# Patient Record
Sex: Female | Born: 1970 | Race: White | Hispanic: No | Marital: Married | Smoking: Former smoker
Health system: Southern US, Community
[De-identification: ages and names within clinical notes are randomized; demographics above are authoritative.]

## PROBLEM LIST (undated history)

## (undated) DIAGNOSIS — E785 Hyperlipidemia, unspecified: Secondary | ICD-10-CM

## (undated) DIAGNOSIS — D649 Anemia, unspecified: Secondary | ICD-10-CM

## (undated) DIAGNOSIS — R102 Pelvic and perineal pain: Secondary | ICD-10-CM

## (undated) DIAGNOSIS — Z8619 Personal history of other infectious and parasitic diseases: Secondary | ICD-10-CM

## (undated) DIAGNOSIS — M25551 Pain in right hip: Secondary | ICD-10-CM

## (undated) DIAGNOSIS — F329 Major depressive disorder, single episode, unspecified: Secondary | ICD-10-CM

## (undated) DIAGNOSIS — R51 Headache: Secondary | ICD-10-CM

## (undated) DIAGNOSIS — O09529 Supervision of elderly multigravida, unspecified trimester: Secondary | ICD-10-CM

## (undated) DIAGNOSIS — N898 Other specified noninflammatory disorders of vagina: Secondary | ICD-10-CM

## (undated) DIAGNOSIS — J302 Other seasonal allergic rhinitis: Secondary | ICD-10-CM

## (undated) DIAGNOSIS — M1990 Unspecified osteoarthritis, unspecified site: Secondary | ICD-10-CM

## (undated) DIAGNOSIS — F419 Anxiety disorder, unspecified: Secondary | ICD-10-CM

## (undated) DIAGNOSIS — F32A Depression, unspecified: Secondary | ICD-10-CM

## (undated) HISTORY — PX: TUBAL LIGATION: SHX77

## (undated) HISTORY — DX: Pain in right hip: M25.551

## (undated) HISTORY — DX: Major depressive disorder, single episode, unspecified: F32.9

## (undated) HISTORY — DX: Supervision of elderly multigravida, unspecified trimester: O09.529

## (undated) HISTORY — DX: Other specified noninflammatory disorders of vagina: N89.8

## (undated) HISTORY — DX: Personal history of other infectious and parasitic diseases: Z86.19

## (undated) HISTORY — PX: FETAL SURGERY SELECTIVE FETOSCOPIC LASER PHOTOCOAGULATION (SFLP) W/ LAPAROTOMY: SHX1620

## (undated) HISTORY — DX: Anxiety disorder, unspecified: F41.9

## (undated) HISTORY — DX: Pelvic and perineal pain: R10.2

## (undated) HISTORY — DX: Depression, unspecified: F32.A

---
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
## 1999-05-28 ENCOUNTER — Encounter: Payer: Self-pay | Admitting: Emergency Medicine

## 1999-05-28 ENCOUNTER — Emergency Department (HOSPITAL_COMMUNITY): Admission: EM | Admit: 1999-05-28 | Discharge: 1999-05-28 | Payer: Self-pay | Admitting: Emergency Medicine

## 1999-08-12 ENCOUNTER — Other Ambulatory Visit: Admission: RE | Admit: 1999-08-12 | Discharge: 1999-08-12 | Payer: Self-pay | Admitting: Family Medicine

## 2000-06-23 ENCOUNTER — Other Ambulatory Visit: Admission: RE | Admit: 2000-06-23 | Discharge: 2000-06-23 | Payer: Self-pay | Admitting: Emergency Medicine

## 2001-01-19 ENCOUNTER — Emergency Department (HOSPITAL_COMMUNITY): Admission: EM | Admit: 2001-01-19 | Discharge: 2001-01-19 | Payer: Self-pay | Admitting: Emergency Medicine

## 2001-12-06 ENCOUNTER — Other Ambulatory Visit: Admission: RE | Admit: 2001-12-06 | Discharge: 2001-12-06 | Payer: Self-pay | Admitting: Gynecology

## 2002-12-12 ENCOUNTER — Other Ambulatory Visit: Admission: RE | Admit: 2002-12-12 | Discharge: 2002-12-12 | Payer: Self-pay | Admitting: Gynecology

## 2003-12-20 ENCOUNTER — Other Ambulatory Visit: Admission: RE | Admit: 2003-12-20 | Discharge: 2003-12-20 | Payer: Self-pay | Admitting: Gynecology

## 2004-02-25 HISTORY — PX: WRIST SURGERY: SHX841

## 2004-08-21 ENCOUNTER — Ambulatory Visit (HOSPITAL_COMMUNITY): Admission: RE | Admit: 2004-08-21 | Discharge: 2004-08-21 | Payer: Self-pay | Admitting: Orthopedic Surgery

## 2004-08-21 ENCOUNTER — Ambulatory Visit (HOSPITAL_BASED_OUTPATIENT_CLINIC_OR_DEPARTMENT_OTHER): Admission: RE | Admit: 2004-08-21 | Discharge: 2004-08-21 | Payer: Self-pay | Admitting: Orthopedic Surgery

## 2005-03-19 ENCOUNTER — Ambulatory Visit (HOSPITAL_COMMUNITY): Admission: RE | Admit: 2005-03-19 | Discharge: 2005-03-19 | Payer: Self-pay | Admitting: Obstetrics and Gynecology

## 2005-08-18 ENCOUNTER — Inpatient Hospital Stay (HOSPITAL_COMMUNITY): Admission: AD | Admit: 2005-08-18 | Discharge: 2005-08-21 | Payer: Self-pay | Admitting: Obstetrics and Gynecology

## 2005-08-22 ENCOUNTER — Encounter: Admission: RE | Admit: 2005-08-22 | Discharge: 2005-09-20 | Payer: Self-pay | Admitting: Obstetrics and Gynecology

## 2005-09-19 ENCOUNTER — Ambulatory Visit: Admission: RE | Admit: 2005-09-19 | Discharge: 2005-09-19 | Payer: Self-pay | Admitting: Obstetrics and Gynecology

## 2005-09-21 ENCOUNTER — Encounter: Admission: RE | Admit: 2005-09-21 | Discharge: 2005-10-08 | Payer: Self-pay | Admitting: Obstetrics and Gynecology

## 2005-09-26 DIAGNOSIS — N898 Other specified noninflammatory disorders of vagina: Secondary | ICD-10-CM

## 2005-09-26 HISTORY — DX: Other specified noninflammatory disorders of vagina: N89.8

## 2005-10-23 DIAGNOSIS — R102 Pelvic and perineal pain unspecified side: Secondary | ICD-10-CM

## 2005-10-23 HISTORY — DX: Pelvic and perineal pain: R10.2

## 2005-10-23 HISTORY — DX: Pelvic and perineal pain unspecified side: R10.20

## 2005-11-28 ENCOUNTER — Other Ambulatory Visit: Admission: RE | Admit: 2005-11-28 | Discharge: 2005-11-28 | Payer: Self-pay | Admitting: Obstetrics and Gynecology

## 2006-09-03 ENCOUNTER — Ambulatory Visit (HOSPITAL_COMMUNITY): Admission: RE | Admit: 2006-09-03 | Discharge: 2006-09-03 | Payer: Self-pay | Admitting: Obstetrics and Gynecology

## 2006-09-07 ENCOUNTER — Inpatient Hospital Stay (HOSPITAL_COMMUNITY): Admission: AD | Admit: 2006-09-07 | Discharge: 2006-09-09 | Payer: Self-pay | Admitting: Obstetrics and Gynecology

## 2006-09-07 ENCOUNTER — Encounter: Payer: Self-pay | Admitting: Obstetrics and Gynecology

## 2006-09-09 ENCOUNTER — Encounter: Payer: Self-pay | Admitting: Obstetrics and Gynecology

## 2006-09-11 ENCOUNTER — Ambulatory Visit (HOSPITAL_COMMUNITY): Admission: RE | Admit: 2006-09-11 | Discharge: 2006-09-11 | Payer: Self-pay | Admitting: Obstetrics and Gynecology

## 2006-09-25 ENCOUNTER — Ambulatory Visit (HOSPITAL_COMMUNITY): Admission: RE | Admit: 2006-09-25 | Discharge: 2006-09-25 | Payer: Self-pay | Admitting: Obstetrics and Gynecology

## 2006-10-02 ENCOUNTER — Ambulatory Visit (HOSPITAL_COMMUNITY): Admission: RE | Admit: 2006-10-02 | Discharge: 2006-10-02 | Payer: Self-pay | Admitting: Obstetrics and Gynecology

## 2006-10-12 ENCOUNTER — Ambulatory Visit (HOSPITAL_COMMUNITY): Admission: RE | Admit: 2006-10-12 | Discharge: 2006-10-12 | Payer: Self-pay | Admitting: Obstetrics and Gynecology

## 2006-10-12 ENCOUNTER — Inpatient Hospital Stay (HOSPITAL_COMMUNITY): Admission: AD | Admit: 2006-10-12 | Discharge: 2006-10-12 | Payer: Self-pay | Admitting: Obstetrics and Gynecology

## 2006-10-13 ENCOUNTER — Inpatient Hospital Stay (HOSPITAL_COMMUNITY): Admission: AD | Admit: 2006-10-13 | Discharge: 2006-10-13 | Payer: Self-pay | Admitting: Obstetrics and Gynecology

## 2006-10-19 ENCOUNTER — Ambulatory Visit (HOSPITAL_COMMUNITY): Admission: RE | Admit: 2006-10-19 | Discharge: 2006-10-19 | Payer: Self-pay | Admitting: Obstetrics and Gynecology

## 2006-10-27 ENCOUNTER — Ambulatory Visit (HOSPITAL_COMMUNITY): Admission: RE | Admit: 2006-10-27 | Discharge: 2006-10-27 | Payer: Self-pay | Admitting: Obstetrics and Gynecology

## 2006-10-30 ENCOUNTER — Ambulatory Visit (HOSPITAL_COMMUNITY): Admission: RE | Admit: 2006-10-30 | Discharge: 2006-10-30 | Payer: Self-pay | Admitting: Obstetrics and Gynecology

## 2006-11-09 ENCOUNTER — Ambulatory Visit (HOSPITAL_COMMUNITY): Admission: RE | Admit: 2006-11-09 | Discharge: 2006-11-09 | Payer: Self-pay | Admitting: Obstetrics and Gynecology

## 2006-11-16 ENCOUNTER — Inpatient Hospital Stay (HOSPITAL_COMMUNITY): Admission: AD | Admit: 2006-11-16 | Discharge: 2006-11-16 | Payer: Self-pay | Admitting: Obstetrics and Gynecology

## 2006-11-16 ENCOUNTER — Encounter: Payer: Self-pay | Admitting: Obstetrics and Gynecology

## 2006-11-19 ENCOUNTER — Ambulatory Visit (HOSPITAL_COMMUNITY): Admission: RE | Admit: 2006-11-19 | Discharge: 2006-11-19 | Payer: Self-pay | Admitting: Obstetrics and Gynecology

## 2006-11-23 ENCOUNTER — Ambulatory Visit (HOSPITAL_COMMUNITY): Admission: RE | Admit: 2006-11-23 | Discharge: 2006-11-23 | Payer: Self-pay | Admitting: Obstetrics and Gynecology

## 2006-11-26 ENCOUNTER — Ambulatory Visit (HOSPITAL_COMMUNITY): Admission: RE | Admit: 2006-11-26 | Discharge: 2006-11-26 | Payer: Self-pay | Admitting: Obstetrics and Gynecology

## 2006-11-30 ENCOUNTER — Ambulatory Visit (HOSPITAL_COMMUNITY): Admission: RE | Admit: 2006-11-30 | Discharge: 2006-11-30 | Payer: Self-pay | Admitting: Obstetrics and Gynecology

## 2006-12-03 ENCOUNTER — Ambulatory Visit (HOSPITAL_COMMUNITY): Admission: RE | Admit: 2006-12-03 | Discharge: 2006-12-03 | Payer: Self-pay | Admitting: Obstetrics and Gynecology

## 2006-12-07 ENCOUNTER — Inpatient Hospital Stay (HOSPITAL_COMMUNITY): Admission: AD | Admit: 2006-12-07 | Discharge: 2006-12-07 | Payer: Self-pay | Admitting: Obstetrics and Gynecology

## 2006-12-07 ENCOUNTER — Encounter: Payer: Self-pay | Admitting: Obstetrics and Gynecology

## 2006-12-10 ENCOUNTER — Inpatient Hospital Stay (HOSPITAL_COMMUNITY): Admission: RE | Admit: 2006-12-10 | Discharge: 2006-12-13 | Payer: Self-pay | Admitting: Obstetrics and Gynecology

## 2006-12-11 ENCOUNTER — Encounter (INDEPENDENT_AMBULATORY_CARE_PROVIDER_SITE_OTHER): Payer: Self-pay | Admitting: Obstetrics and Gynecology

## 2007-06-23 DIAGNOSIS — M25551 Pain in right hip: Secondary | ICD-10-CM

## 2007-06-23 HISTORY — DX: Pain in right hip: M25.551

## 2007-11-16 IMAGING — US US MFM FETAL BPP W/ NONSTRESS ADDL GEST
1 series · 14 of 28 positions shown · non-contrast
Comparison: none

OBSTETRICAL ULTRASOUND:
 This ultrasound was performed in The [HOSPITAL], and the [facility] report will be stored to [REDACTED] PACS.

[Series 1: us mfm fetal bpp w/ nonstress addl gest · 14 of 33 slices shown]
[im 2/33]
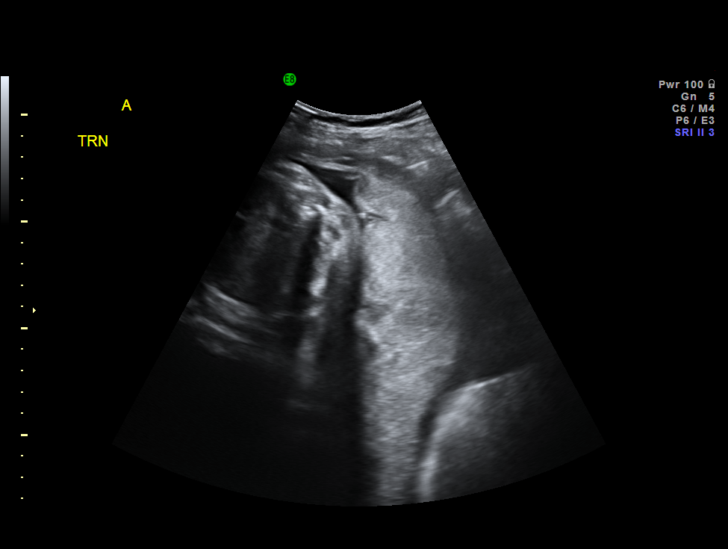
[im 4/33]
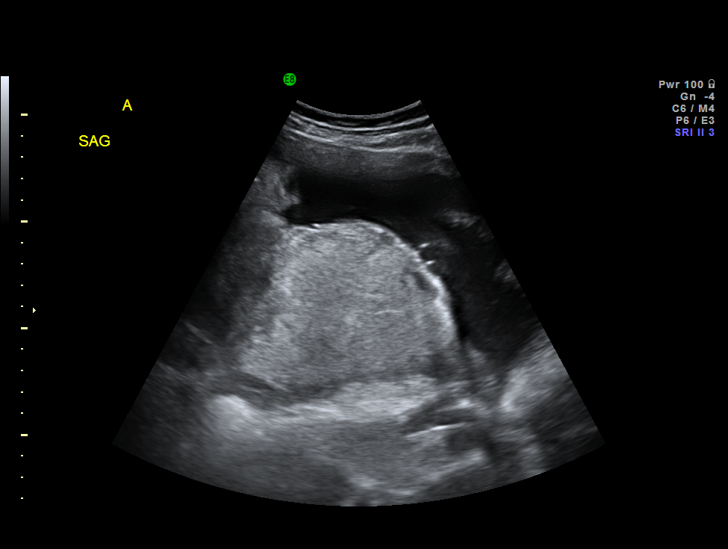
[im 6/33]
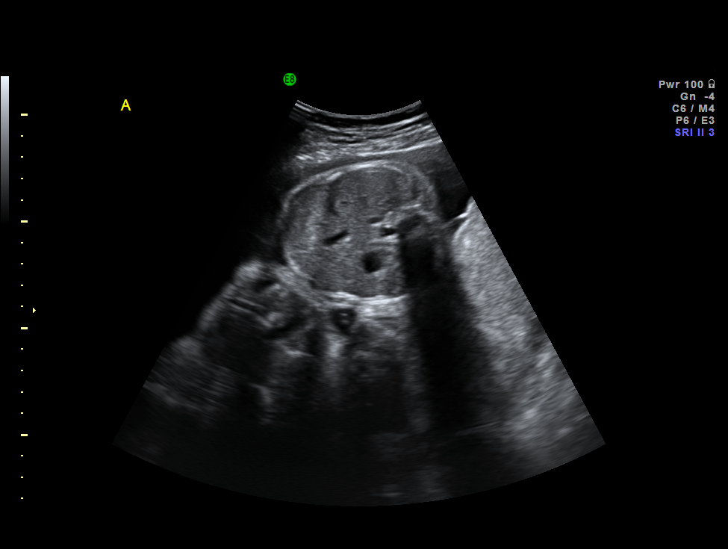
[im 9/33]
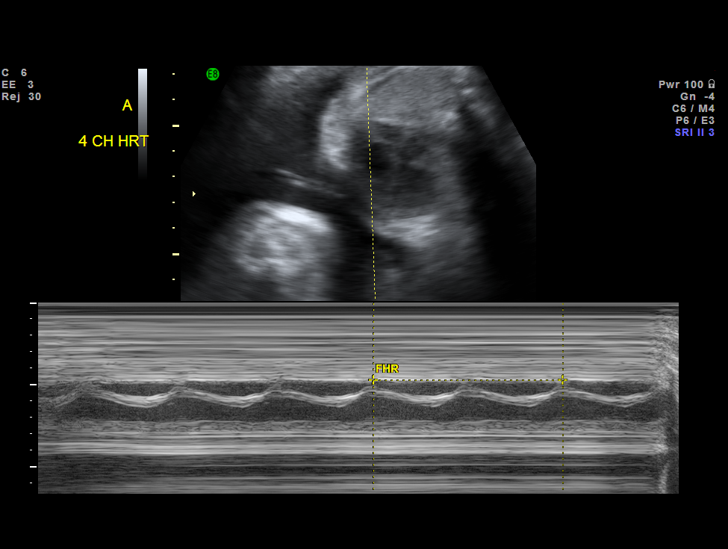
[im 11/33]
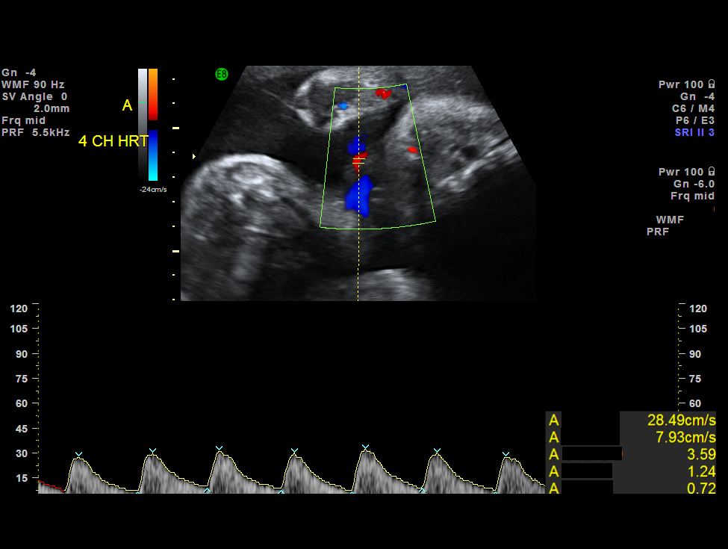
[im 14/33]
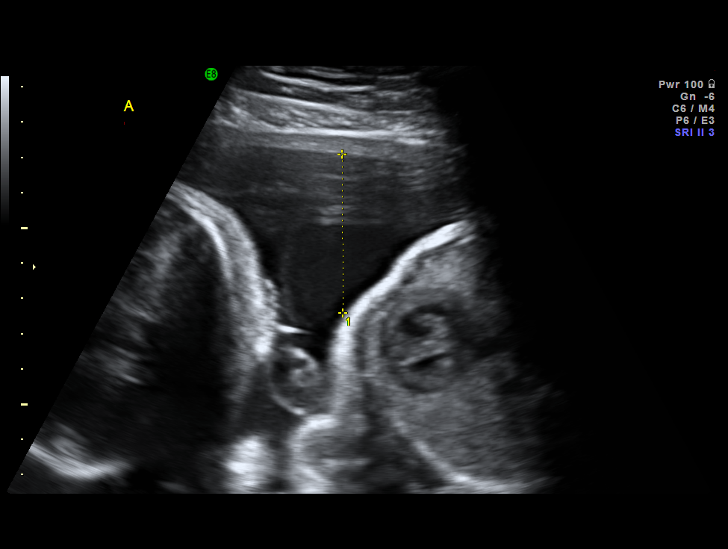
[im 16/33]
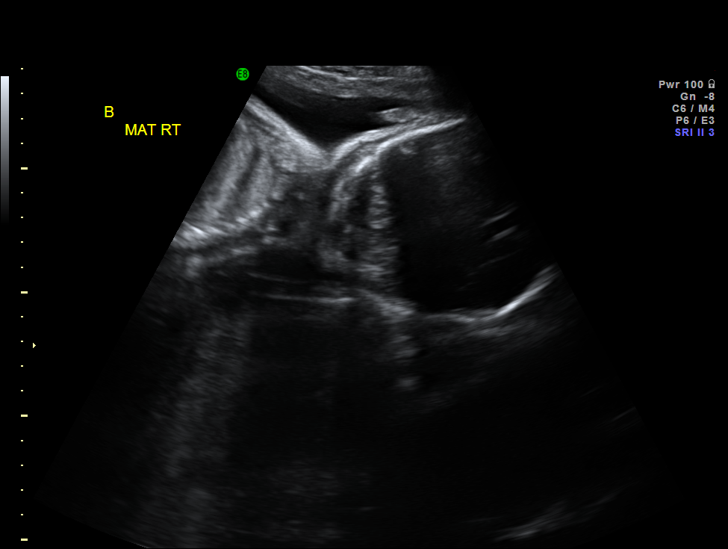
[im 18/33]
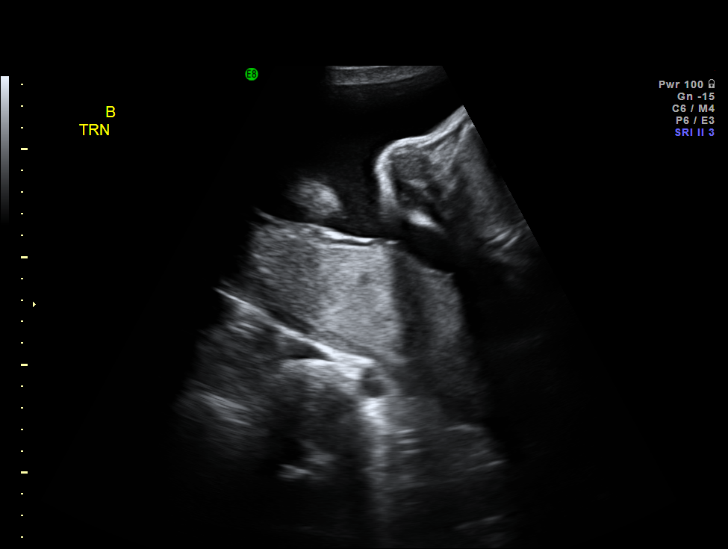
[im 21/33]
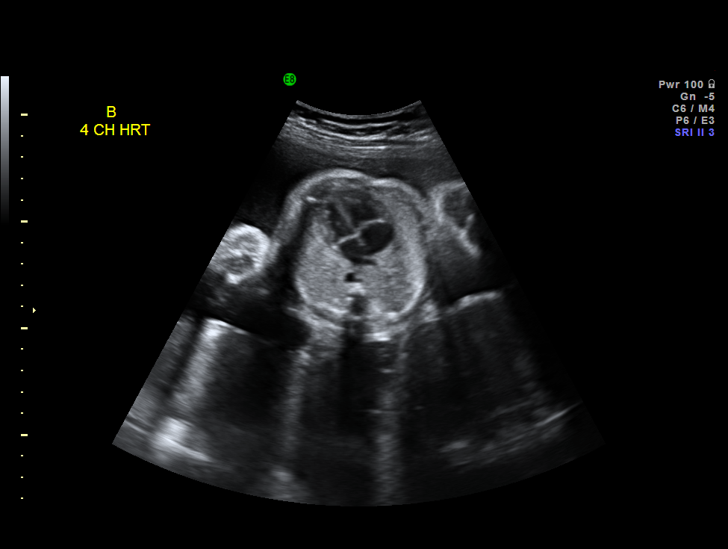
[im 23/33]
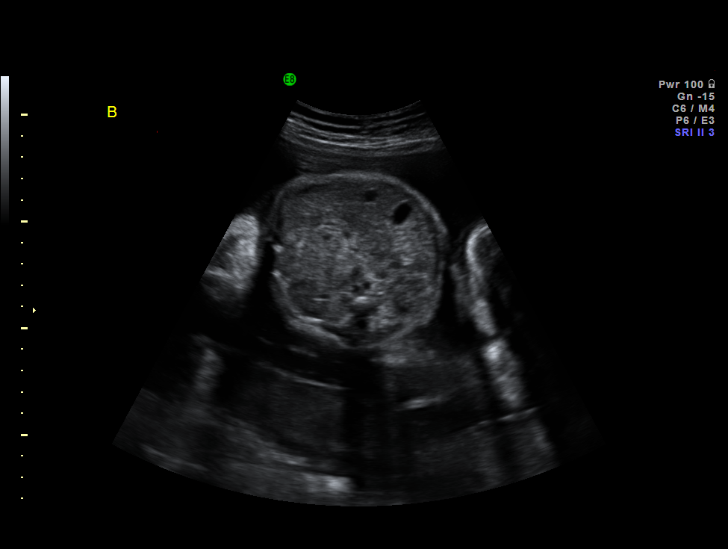
[im 25/33]
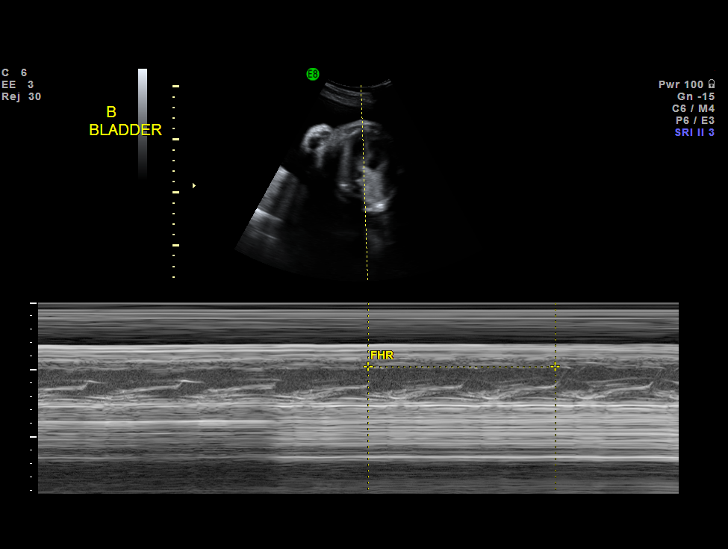
[im 28/33]
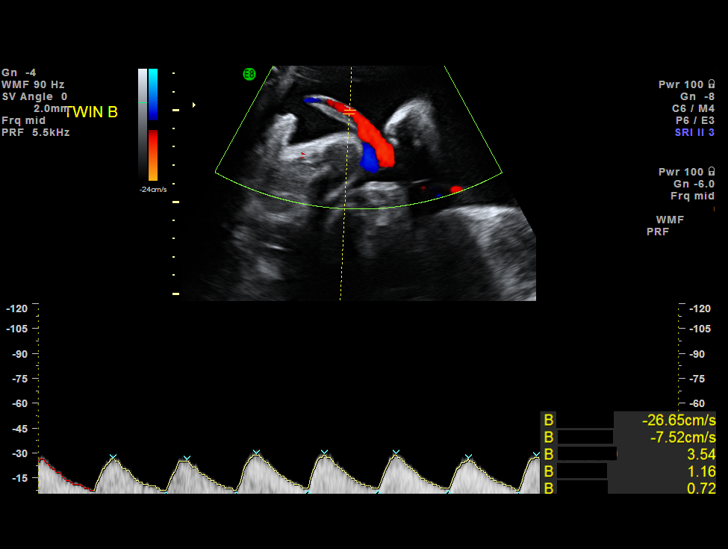
[im 30/33]
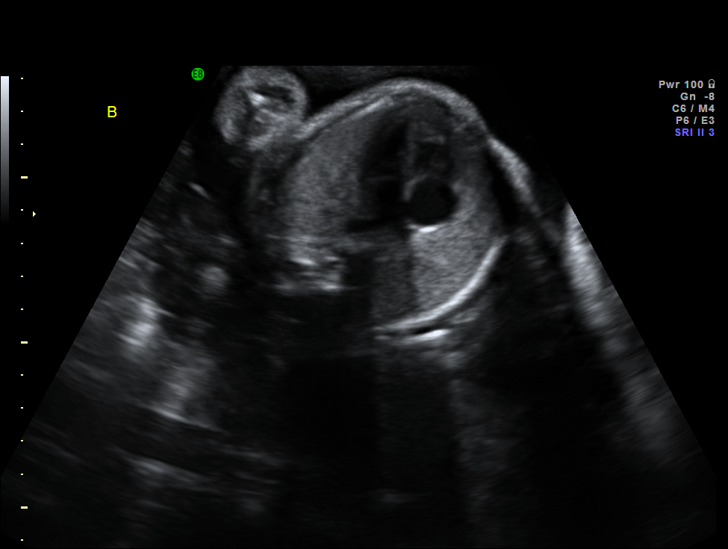
[im 33/33]
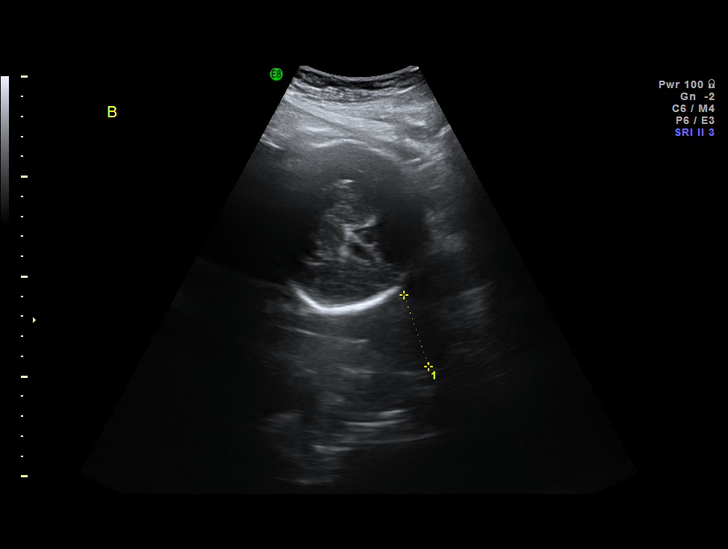

[14 of 28 positions shown; findings below may reference images not displayed]

IMPRESSION: The [facility] report has also been faxed to the ordering physician.

## 2008-02-25 DIAGNOSIS — N898 Other specified noninflammatory disorders of vagina: Secondary | ICD-10-CM

## 2008-02-25 HISTORY — DX: Other specified noninflammatory disorders of vagina: N89.8

## 2010-03-17 ENCOUNTER — Encounter: Payer: Self-pay | Admitting: Obstetrics and Gynecology

## 2010-07-09 NOTE — Discharge Summary (Signed)
[name], [name]          ACCOUNT NO.:  [id number]   MEDICAL RECORD NO.:  [id number]          PATIENT TYPE:  INP   LOCATION:  9371                          [facility]ILITY:  [facility]   PHYSICIAN:  Janine Limbo, M.D.DATE OF BIRTH:  05-04-1970   DATE OF ADMISSION:  09/07/2006  DATE OF DISCHARGE:  09/09/2006                               DISCHARGE SUMMARY   ADMISSION DIAGNOSES:  1. Intrauterine twin pregnancy at 39 and 6/7 week.  2. 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  3. Preterm uterine contractions without cervical change.   DISCHARGE DIAGNOSES:  1. Intrauterine twin pregnancy at 83 and 6/7 week.  2. 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  3. Preterm uterine contractions without cervical change.   HOSPITAL COURSE:  The patient is a 40 year old gravida 4, para 1, 0-2-1  at 57 and 6/7 weeks with twins who presented from maternal fetal  medicine status post an amnio reduction of polyhydramnios of twin A,  secondary to twin/twin transfusion syndrome.  Thirteen-hundred mL of  fluid were removed from twin A and chromosomes were sent.  The patient  was admitted to the adult Intensive Care Unit for [facility] monitoring after  amnio with plan for 24 to 48 hour observation as well as magnesium  sulfate therapy with possible plan for laser ablation of vascular  anastomosis.  Her pregnancy has been followed by the [facility] MD service and has been remarkable for:  1. 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  2. Advanced maternal age with patient declining first trimester screen      and alpha fetoprotein.  3. TAB x2.  4. Closely spaced pregnancy.  5. Marginal cord insertion of twin A.  6. Echogenic intracardiac focus of twin B.   The patient was admitted on the evening of September 08, 2006.  Initial  ultrasound upon admission  showed twin A in a vertex presentation with  polyhydramnios.  Estimated fetal weight at 298 grams which is less than  the 25th percentile.  Twin B was transverse with oligohydramnios.  Echogenic intracardiac focus in the left ventricle.  Estimated fetal  weight 275 grams which is also less than the 25th percentile.  Umbilical  artery Doppler study was at the 64th percentile.  The patient's cervix  was measuring 3.27 cm.  CBC was within normal limits.  Hemoglobin was  12.5.  White blood cell count was 12.8.  The patient was started on  magnesium Sulfate therapy.  The patient was contracting every two to  four minutes upon admission but the contractions were not perceived by  the patient.  She was not having any leaking pain or bleeding.  By September 08, 2006 the patient was continuing to feel well.  Fetal heart rates  were stable by Doppler.  Contractions were rare.  After speaking with  maternal fetal medicine it was determined that ultrasound would be  repeated on September 09, 2006.  Results of the ultrasound on 7/16 showed  twin A with subjectively increased amniotic fluid.  Largest pocket was  6.9 cm.  Umbilical artery studies were within normal limits.  Twin B was  still with oligohydramnios, largest pocket 0.9 cm.  The bladder for twin  B was visible but appeared very small.  There was no evidence of hydrops  or abnormal fluid collections for either twin.  It was planned for the  patient to have a fetal echocardiogram and that was performed on June 16  by Dr. Mayer Camel.  Essentially the findings were within normal limits for  both twins except for the echogenic intracardiac focus on twin B.  The  plan was made to repeat the echocardiogram in two to four weeks if the  patient does not got to [facility] for the laser ablation surgery.  Dr.  Mayer Camel or Dr. Elizebeth Brooking will call the patient to schedule that appointment.  Her follow-up by maternal fetal medicine includes repeat ultrasound on  September 11, 2006.   The doctors from [facility] will call the patient on  July 18 or 19 to discuss the procedure.  The patient as deemed to have  received the full benefit of her hospital stay and she was discharged  home.   DISCHARGE MEDICATIONS:  Prenatal vitamin one p.o. daily.   DISCHARGE INSTRUCTIONS:  Preterm labor precautions are reviewed with the  patient as well as bedrest for her activity level.  Discharge follow-up  will occur at [facility] in one week and as previously  mentioned maternal fetal medicine ultrasound on July 18, possible  echocardiogram in two to four weeks if the patient does not go to  [facility] for the laser ablation procedure.      Cam Hai, C.N.M.      Janine Limbo, M.D.  Electronically Signed    KS/MEDQ  D:  09/09/2006  T:  09/10/2006  Job:  [id number]

## 2010-07-09 NOTE — Op Note (Signed)
[name], [name]          ACCOUNT NO.:  [id number]   MEDICAL RECORD NO.:  [id number]          PATIENT TYPE:  INP   LOCATION:  9304                          [facility]   PHYSICIAN:  Janine Limbo, M.D.DATE OF BIRTH:  [birth year]   DATE OF PROCEDURE:  12/10/2006  DATE OF DISCHARGE:                               OPERATIVE REPORT   PREOPERATIVE DIAGNOSIS:  1. 34 1/[redacted] weeks gestation.  2. Twin gestation with monochorionic and diamniotic membranes.  3. Status post twin/twin transfusion with laser ablation of the      placental vessels in the second trimester.  4. 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  5. Intrauterine growth retardation for twin A.  6. Advanced maternal age with normal genetic studies.   POSTOPERATIVE DIAGNOSIS:  1. 34 1/[redacted] weeks gestation.  2. Twin gestation with monochorionic and diamniotic membranes.  3. Status post twin/twin transfusion with laser ablation of the      placental vessels in the second trimester.  4. 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  5. Intrauterine growth retardation for twin A.  6. Advanced maternal age with normal genetic studies.   PROCEDURE:  Primary low transverse cesarean section.   SURGEON:  Janine Limbo, M.D.   FIRST ASSISTANT:  Marie L. Williams, C.N.M.   ANESTHESIA:  Spinal.   DISPOSITION:  [name] is a 40 year old female, gravida 4, para 1-  0-2-1, who presents with the above mentioned diagnosis.  The patient  understands the indications for her surgical procedure and she accepts  the risks of, but not limited to, anesthetic complications, bleeding,  infection, and possible damage to surrounding organs.   FINDINGS:  The first infant delivered was a 3.99 pound female infant  [name]).  The infant was delivered from a vertex presentation.  The  Apgars were 7 at 1 minute and 8 at 5 minutes.  The second infant  delivered was a  4.20 pound female [name]).  The Apgars were 7 at 1  minute and 8 at 5 minutes.  The placenta was noted to be monochorionic  and diamniotic.  Scarring was noted on the placenta and this was  believed to be from where the patient had laser ablation of the vessels.  The uterus, fallopian tubes, and the ovaries appeared normal for the  gravid state.   OPERATIVE PROCEDURE:  The patient was taken to the operating room where  a spinal anesthetic was given.  The patient's abdomen, perineum, and  vagina were prepped with multiple layers of Betadine.  A Foley catheter  was placed in the bladder.  The patient was then sterilely draped.  The  lower abdomen was injected with 10 mL of 0.5% Marcaine with epinephrine.  A low transverse incision was made in the abdomen and carried sharply  through the subcutaneous tissue, fascia, and the anterior peritoneum.  The bladder flap was developed.  An incision was made in the lower  uterine segment and the incision was extended in a low transverse  fashion.  The membranes were ruptured from  both amniotic cavities.  The  first infant delivered was a 3.99 pounds female [name]).  The second  infant delivered was a 4.2 pounds female [name]).  The infants were  handed to the awaiting pediatric team.  The placenta was removed and  sent to pathology for evaluation.  The uterine cavity was cleaned of  amniotic fluid, clotted blood, and membranes.  The uterine incision was  closed using a running locking suture of 2-0 Vicryl followed by figure-  of-eight sutures of 2-0 Vicryl.  Hemostasis was adequate.  The pelvis  was vigorously irrigated.  The anterior peritoneum and abdominal  musculature were reapproximated in the midline using 2-0 Vicryl.  The  fascia was closed using a running suture of 0 Vicryl followed by three  interrupted sutures of 0 Vicryl.  The subcutaneous layer was closed  using a running suture of 0 Vicryl.  The skin was reapproximated using a   subcuticular suture of 3-0 Monocryl.  Sponge, needle, and instrument  counts were correct on two occasions.  Estimated blood loss for the  procedure was 700 mL.  The patient tolerated her procedure well.  She  was taken to the recovery room in stable condition.  She was noted to  drain clear yellow urine.  The infants were taken to the intensive care  nursery to be watched carefully.  The placenta was sent to pathology for  evaluation.      Janine Limbo, M.D.  Electronically Signed     AVS/MEDQ  D:  12/10/2006  T:  12/11/2006  Job:  [id number]   cc:   Toma Copier, MD

## 2010-07-09 NOTE — H&P (Signed)
Sabrina, Camacho          ACCOUNT NO.:  [id number]   MEDICAL RECORD NO.:  [id number]          PATIENT TYPE:  INP   LOCATION:                                [facility]   PHYSICIAN:  Janine Limbo, M.D.DATE OF BIRTH:  Feb 25, 1970   DATE OF ADMISSION:  12/10/2006  DATE OF DISCHARGE:                              HISTORY & PHYSICAL   HISTORY OF PRESENT ILLNESS:  [name] is a 40 year old female,  gravida 4 para 1-0-2-1 who presents at 34.[redacted] weeks gestation (EDC is  01/19/07).  The patient has been followed at the [facility]  division of [facility].  The pregnancy has been  complicated by the fact that the patient has a twin gestation that is  noted to be monochorionic and diamniotic.  During the second trimester,  the patient was noted to have a twin-twin transfusion syndrome.  The  patient was seen in [place], [place], where she had laser ablation of  the placental vessels.  Since that time, she has had normalization of  amniotic fluid volume.  The patient had amniocentesis performed that  showed normal chromosomes for both of her babies.  The patient is noted  to have a congenital cystic adenomatoid malformation for Twin B.  The  most recent growth ultrasound was performed on November 30, 2006.  At that  time, the first infant was noted to weigh 3 pounds 1 ounce which was  thought to be less than the 10 percentile.  The second infant was noted  to weigh 3 pounds and 13 ounces and was noted to be at the 28th  percentile.  The patient has had reactive non-stress test.  Doppler  studies have been performed.  The most recent Doppler study was on  December 07, 2006.  At that time, the Doppler studies were said to be  appropriate and the biophysical profiles were noted to be 8/8 for both  infants.  The non-stress tests were reactive for both infants.   ALLERGIES:  No known drug allergies.  The patient is not allergic to  Betadine nor is she  allergic to latex.   SOCIAL HISTORY:  The patient denies cigarette use, alcohol use, and  recreational drug use.   OBSTETRICAL HISTORY:  In 2007, the patient had a vaginal delivery at [redacted]  weeks gestation in which she delivered an 8 pound 4 ounce female infant.   REVIEW OF SYSTEMS:  Normal pregnancy complaints.   FAMILY HISTORY:  Noncontributory.   PHYSICAL EXAMINATION:  Weight is 197 pounds.  HEENT:  Within normal limits.  CHEST:  Clear.  HEART:  Regular rate and rhythm.  BREASTS:  Without masses.  ABDOMEN:  Gravid with a fundal height of 41 cm.  EXTREMITIES:  Grossly normal.  NEUROLOGIC EXAM:  Grossly normal.  PELVIC EXAM:  The cervix was noted to be long when last checked.   LABORATORY DATA:  Blood type is O positive, antibody screen negative,  VDRL nonreactive.  Rubella immune.  HBFAG negative, HIV nonreactive.  GC  negative, chlamydia negative, Glucola screen within normal limits.   ASSESSMENT:  1. 34.[redacted]  weeks gestation.  2. Twin gestation with monochorionic and diamniotic membrane.  3. Status post twin-twin transfusion with laser ablation of the      placenta in the second trimester.  4. 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  5. Intrauterine growth retardation for twin A.  6. Advanced maternal age with normal genetic studies.   PLAN:  The patient will undergo a primary low-transverse cesarean  section.  The patient understands her management options and she  understands the risks and benefits associated with those management  options.  She accepts the risk of, but not limited to, anesthetic  complications, bleeding, infections, and possible damage to the  surrounding organs.      Janine Limbo, M.D.  Electronically Signed     AVS/MEDQ  D:  12/08/2006  T:  12/08/2006  Job:  [id number]   cc:   Toma Copier, MD

## 2010-07-09 NOTE — Consult Note (Signed)
[name], [name]          ACCOUNT NO.:  [id number]   MEDICAL RECORD NO.:  [id number]          PATIENT TYPE:  OUT   LOCATION:  MFM                           [facility]   PHYSICIAN:  Janine Limbo, M.D.DATE OF BIRTH:  08-17-1970   DATE OF CONSULTATION:  12/07/2006  DATE OF DISCHARGE:                                 CONSULTATION   HISTORY OF PRESENT ILLNESS:  Ms. Schlagel has been followed at the  [facility] of [facility] for this pregnancy that has been complicated by a twin  gestation.  She is currently [redacted] weeks gestation.  She had a twin-twin  transfusion and had laser ablation of placental vessels in [place],  [place].  She has done quite well.  She does have an infant with  intrauterine growth retardation.  She has biweekly assessment at the  maternal fetal medicine center at the [facility] [place].  She had a biophysical profile performed today, and both infants received  as a score of 8/8.  However, there was a deceleration noted.  The  patient presents to maternity admissions for a nonstress test.   OBJECTIVE:  Temperature is 97, blood pressure 117/70, pulse 96,  respirations 20.  The patient's nonstress tests were reactive for both  infants.   ASSESSMENT:  1. At 34-week gestation.  2. Twins  3. Twin-twin transfusion status post laser ablation of the blood      vessels.  4. Intrauterine growth retardation.  5. Deceleration for one of the infants   PLAN:  The patient will be discharged to home.  We will continue to  follow the patient with the maternal fetal medicine center with  nonstress test, biophysical profiles, and frequent evaluations.  I  called the patient on the telephone, and she and her husband and I have  a long discussion about our management options.  We discussed our  options of delivery at [redacted] weeks gestation versus continuation of the  pregnancy.  The patient  understands at this point the infants continue  to do well and that we do not have to deliver the babies at this time.  She elects to present to the office on December 09, 2006, for evaluation  and then repeat biophysical profiles on December 10, 2006.  At that  point, she will decide whether or not she wants to proceed with delivery  for continue with the infants' in utero for another several days.      Janine Limbo, M.D.  Electronically Signed     AVS/MEDQ  D:  12/08/2006  T:  12/08/2006  Job:  [id number]

## 2010-07-09 NOTE — Discharge Summary (Signed)
[name], [name]          ACCOUNT NO.:  [id number]   MEDICAL RECORD NO.:  [id number]          PATIENT TYPE:  INP   LOCATION:  9304                          [facility]   PHYSICIAN:  Osborn Coho, M.D.   DATE OF BIRTH:  [birth year]   DATE OF ADMISSION:  12/10/2006  DATE OF DISCHARGE:  12/13/2006                               DISCHARGE SUMMARY   ADMITTING DIAGNOSES:  1. Twin gestation at 34-5/7 weeks.  2. Monochorionic and diamniotic membranes.  3. Status post twin twin transfusion with laser ablation of the      placenta in the second trimester.  4. 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  5. Intrauterine growth retardation for twin A  6. Advanced maternal age with normal genetic studies.   DISCHARGE DIAGNOSES:  1. Twin gestation at 34-5/7 weeks.  2. Monochorionic and diamniotic membranes.  3. Status post twin twin transfusion with laser ablation of the      placenta in the second trimester.  4. 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  5. Intrauterine growth retardation for twin A  6. Advanced maternal age with normal genetic studies.   PROCEDURES:  Primary low transverse cesarean section.   HOSPITAL COURSE:  [name] is a 40 year old married white female  gravida 4, para 1-0-2-1 who presents at 34-5/7 weeks' gestation for  scheduled primary low transverse cesarean section.  The patient has been  followed by the [facility].  MD service has been remarkable  for:   1. Twin gestation with twin gestation that is monochorionic and      diamniotic.  2. Twin twin transfusion with laser ablation of placenta.  3. Congenital cystic adenomatoid malformation formation of twin B.  4. Intrauterine growth retardation for twin A.  5. Advanced maternal age with normal genetic studies.   The most recent growth ultrasound was on November 30, 2006.  At that time  this first infant was estimated to weigh 3 pounds 1 ounce which was less  than tenth percentile.  Second twin was noted to weight 3 pounds 13  ounces which was at the 28th percentile.  The patient continued to have  reactive NST's for both infants as well as normal Doppler studies and  biophysical profile scoring 8/8 for twin A and B.  The patient was  prepared for surgery and taken to the operating room.  Twin A was a  viable female infant born at 35 and weighing 3.99 pounds.  His name is  Gabe.  Apgars are 7 at 1 minute and 8 at 5 minutes.  Twin B was born at  55.  Was also a boy by the name of Zeek.  Apgars were 7 at 1 minute  and 8 at 5 minutes.  Weight was 4.2 pounds.  The infants were taken to  the NICU in good condition.  The patient tolerated the remainder of the  cesarean section well.  Was taken to the recovery room.  By postop day  #1 the patient was feeling well.  Her hemoglobin was 11.3 and had been  13.8 preoperatively.  Her vital signs were stable.  She was pumping  breast milk.  Infants are stable in the NICU.  By postop day #2 the  patient continued to feel well.  Vital signs were stable.  She continued  pumping.  By postop day #3 the patient was deemed to have received the  full benefit of her hospital stay.  She was feeling well.  Vital signs  are stable.  Infants are stable in the NICU.  The patient was to be  discharged home.  Discharge instructions per [facility]  handout.   DISCHARGE MEDICATIONS:  1. Motrin 600 mg one p.o. q.6 h. p.r.n. pain.  2. Tylox 1-2 p.o. q.3-4 h. p.r.n. pain.  3. Prenatal vitamin one p.o. daily.  4. Micronor to start December 27, 2006 per patient supply at home.   FOLLOWUP:  Discharge follow-up will occur at [facility] in  six weeks or as needed.      Cam Hai, C.N.M.      Osborn Coho, M.D.  Electronically Signed    KS/MEDQ  D:  12/13/2006  T:  12/14/2006  Job:  [id number]

## 2010-07-09 NOTE — H&P (Signed)
[name], [name]          ACCOUNT NO.:  [id number]   MEDICAL RECORD NO.:  [id number]          PATIENT TYPE:  INP   LOCATION:  9371                          FACILITY:  [facility]   PHYSICIAN:  Crist Fat. Rivard, M.D. DATE OF BIRTH:  [birth year]   DATE OF ADMISSION:  09/07/2006  DATE OF DISCHARGE:                              HISTORY & PHYSICAL   PRIORITY HISTORY AND PHYSICAL   Ms. 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  1300 ml of fluid were removed from Twin  A, and chromosomes were sent.  The patient is to be admitted for  monitoring after amnio with plan for 24-48 hours observation with  possible plan for laser ablation of vascular anastomosis.  This may  require transfer to [facility].  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   PRENATAL LABORATORY DATA:  Blood type is O-positive, Rh antibody  negative, VDRL nonreactive, Rubella titer positive, hepatitis-B surface  antigen negative, HIV nonreactive, GC and Chlamydia cultures were  negative at the first visit, Pap was also done in April and was normal.  Patient declined first trimester screening and quadruple screen.  Hemoglobin upon entry into practice was 13.1.   HISTORY OF PRESENT PREGNANCY:  Patient entered care at approximately  eight weeks.  Ultrasound was done at that time secondary to slight size  greater than dates.  Twin gestation was noted with congruent EDC of  January 19, 2007.  Monochorionic diamniotic twins were identified.   The  patient declined genetic screening.  She had an ultrasound at 20 weeks  showing twin-to-twin transfusion with a marginal cord insertion of Twin  A, with Twin A having polyhydramnios, enlarged bladder, hepatomegaly,  and hydronephrosis.  Twin B was measuring equal to Twin A but had an  echogenic intracardiac focus, oligohydramnios, the ventricles were  poorly seen, and an empty bladder was noted.  Dr. Stefano Gaul discussed the  findings with the mother and father at the time of that visit.  Recommendation was to follow up at Maternal-Fetal Medicine, consult was  made with the decision made to proceed with a consult visit and  evaluation at Maternal-Fetal Medicine.  The patient was seen there  today, and the plan was made for amniotic fluid reduction.  Twin A was  seen to be 298 grams, which is less than the 25th percentile.  Twin B  was at 275 grams with the same less than 25th percentile. Twin A had  polyhydramnios with the largest pocket of 10.44 cm, anatomy was noted to  be normal.  Twin B had oligohydramnios, which was subjectively decreased  with the largest pocket of 2.84 cm.  Fetus A was in the cephalic  position; Twin B was in the transverse position.  There was an echogenic  intracardiac focus noted in Twin B, and Doppler flow studies were normal  of Twin B.  Cervical length was 3.27.  Placenta was noted to be  posterior and away from the cervical os.  The patient has therefore had  a reduction in the fluid of 1300 ml of Twin A.  She was therefore  admitted for contraction monitoring.  She was noted to be contracting  every two to four minutes, which the patient was not aware of.   OBSTETRICAL HISTORY:  In 46 and in 1997, she had first trimester  terminations.  In 2007, she had a vaginal birth of a female infant,  weighed 8 pounds 4 ounces at 40 and 2/7th weeks, she was in labor 21  hours, she had epidural anesthesia, she had no complications.   PAST MEDICAL HISTORY:  1. She  is a previous oral contraceptive and [employer] user.  2. She reports the usual childhood illnesses.  3. She had anemia in her 36s.   PAST SURGICAL HISTORY:  1. Wisdom teeth removed in 1988.  2. Surgery on her right wrist for an injury.   Her only other hospitalization was for childbirth.   She has no known medication allergies.   FAMILY HISTORY:  Her paternal uncles had heart disease.  Her mother and  sisters have hypertension.  Paternal grandmother had a stroke.  Paternal  cousin had lupus.  Paternal cousin also had scleroderma.  Paternal uncle  and paternal aunt had lung cancer.   GENETIC HISTORY:  Remarkable for the patient's age of 23, but the  patient declined testing.  Her history now is also remarkable for twin-  to-twin transfusion noted.   SOCIAL HISTORY:  The patient is married to the father of the babies.  He  is involved and supportive.  His name is [name].  The patient  has a college education.  She is a Futures trader.  Her husband is also  college educated.  He is a Systems analyst.  She is Caucasian and of  the [place] faith.  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  She denies any alcohol, drug, or tobacco use  during this pregnancy.   PHYSICAL EXAMINATION:  VITAL SIGNS:  Stable.  The patient is afebrile.  HEENT:  Within normal limits.  LUNGS:  Both breath sounds are clear.  HEART:  Regular rate and rhythm without murmurs.  BREASTS:  Soft and nontender.  ABDOMEN:  Fundal height is approximately 26 to 28 cm.  Fetal heart rate  of A is 140 by auscultation.  Twin B is 148 by auscultation.  Toco  initially showed uterine contractions every two minutes and mild.  EXTREMITIES:  Deep tendon reflexes are 2+ without clonus.  There is a  trace edema noted.   IMPRESSION:  1. Intrauterine twin pregnancy at 72 and 6/7th weeks.  2. Twin-to-twin transfusion, now status post amnio reduction of      polyhydramnios of Twin A.  3.  Preterm uterine contractions without cervical change.   PLAN:  1. Admitted to AICU per consult with Dr. Estanislado Pandy as attending      physician.  2. Magnesium sulfate therapy plan.  3. Maternal-Fetal Medicine will continue to consult on the patient      with the plan for repeat ultrasound in one to two days.  4. Support to  the patient and her husband for fetal issues.  5. MD's will follow.  6. Decision will eventually be made whether the patient is to be      transferred or sent to [facility] for laser      ablation of the twin-to-twin transfusion and vascular anastomosis.      Renaldo Reel Emilee Hero, C.N.M.      Crist Fat Rivard, M.D.  Electronically Signed    VLL/MEDQ  D:  09/07/2006  T:  09/07/2006  Job:  [id number]

## 2010-07-12 NOTE — Op Note (Signed)
NAMECALIYA, [name]          ACCOUNT NO.:  [id number]   MEDICAL RECORD NO.:  [id number]          PATIENT TYPE:  AMB   LOCATION:  DSC                          FACILITY:  [facility]   PHYSICIAN:  Artist Pais. Weingold, M.D.DATE OF BIRTH:  10-Dec-1970   DATE OF PROCEDURE:  08/21/2004  DATE OF DISCHARGE:                                 OPERATIVE REPORT   PREOPERATIVE DIAGNOSIS:  Right wrist internal derangement.   POSTOPERATIVE DIAGNOSIS:  Right wrist internal derangement.   PROCEDURES:  1.  Right wrist arthroscopy.  2.  Debridement of transjugular fibrocartilage complex scapholunate.  3.  Ganglionectomy.   SURGEON:  Artist Pais. Mina Marble, M.D.   ASSISTANT:  Aura Fey. Bobbe Medico.   ANESTHESIA:  General.   TOURNIQUET TIME:  40 minutes.   COMPLICATIONS:  None.   DRAINS:  None.   OPERATIVE REPORT:  The patient was taken to the operating room.  After the  induction of adequate general anesthesia, the right upper extremity was  prepped and draped in sterile fashion.  An Esmarch was used to exsanguinate  the limb.  The tourniquet was inflated to 250 mmHg.  At this point in time,  the patient's right upper extremity was padded and placed in the Concept  wrist traction tower with 12 pounds on countertraction across the  radiocarpal joint.  A standard 3.4 arthroscopic portal was established 1 cm  distal to Lister's tubercle.  The skin was incised sharply.  Dissection was  carried down bluntly to the capsule level.  The scope was introduced to the  joint.  A 6.2 valve flow portal was established under direct vision using an  18 gauge needle and a 4.5 working portal using the same technique.  Inspection and visualization of the joint revealed intact radial side  ligaments, a partial torn SL ligament, a small bit of dorsal  synovitis/possible ganglion at the dorsal most extent of the SL ligament and  a TFCC central tear.  Using a combination of 2.9 suction shaver and 2.3 mm  ArthroCare wand,  the TFCC tear was debrided down to a stable rim,  alternating the scope and introducing the 4.5 and 3.4 portals.  The SL  ligament was carefully debrided using the same technique, as well as a small  possible ganglion at the very dorsal most extent of the SL  ligament area.  After this was done, the instruments were removed from the  field.  It was thoroughly irrigated.  The portals were closed with 5-0 nylon  and a sterile dressing with Xeroform, 4 x 4s, fluffs and a compressive wrap  and volar splint were applied.  The patient tolerated the procedure well and  went to recovery in stable fashion.       MAW/MEDQ  D:  08/21/2004  T:  08/21/2004  Job:  [id number]

## 2010-07-12 NOTE — H&P (Signed)
NAME:  [name], [name]              ACCOUNT NO.:  s   MEDICAL RECORD NO.:  [id number]          PATIENT TYPE:  INP   LOCATION:  9173                          [facility]ILITY:  [facility]   PHYSICIAN:  Sandra A. Rivard, M.D. DATE OF BIRTH:  Nov 06, 1970   DATE OF ADMISSION:  08/18/2005  DATE OF DISCHARGE:                                HISTORY & PHYSICAL   This is a 40 year old gravida 3, para 0-0-2-0 at 40-3/7 weeks who presents  with increasing contractions over the last three hours.  She denies leaking  or bleeding and reports positive fetal movement.  Pregnancy has been  followed by the nurse midwife service and remarkable for:  1.  EAB x2.  2.  AMA.  3.  Group B Strep negative.   ALLERGIES:  NO KNOWN DRUG ALLERGIES.   OBSTETRIC HISTORY:  1.  Remarkable for elective abortion in 1990.  2.  Elective abortion in 1997.   PAST MEDICAL HISTORY:  Childhood varicella.   PAST SURGICAL HISTORY:  Wisdom teeth in 1988 and right wrist surgery in  2006.   FAMILY HISTORY:  Uncles with heart disease.  Mother and sisters with  hypertension.  Grandmother with stroke.  Cousin with lupus and scleroderma.   GENETIC HISTORY:  Remarkable for advanced maternal age.   SOCIAL HISTORY:  Patient is married to [employer] who is involved and  supportive.  She is of the [place] faith.  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  She denies any alcohol, tobacco or drug use.   PRENATAL LABORATORY DATA:  Hemoglobin 13.3, platelets 330.  Blood type O  positive.  Antibody screen negative.  RPR nonreactive.  Rubella immune.  Hepatitis negative.  HIV negative.  Pap test normal.  Gonorrhea and  Chlamydia negative.   HISTORY OF CURRENT PREGNANCY:  [facility]  She declined quad screen.  She had an ultrasound at 18 weeks which was  normal with no trisomy markers.  She had a Glucola at 26 weeks which was  normal.  She took childbirth  classes from [employer].  She is group B  Strep  negative at term.   OBJECTIVE:  VITAL SIGNS:  Stable.  Afebrile.  HEENT:  Within normal limits.  Thyroid normal.  CHEST:  Clear to auscultation.  CARDIOVASCULAR:  Regular rate and rhythm.  ABDOMEN:  Gravid at 40 cm.  Vertex Leopold's, EFM shows reactive fetal heart  rate with uterine contractions every two to four minutes.  PELVIC:  Cervix is 3 to 4, 90%, -1 station with a vertex presentation.  EXTREMITIES:  Within normal limits.   ASSESSMENT:  1.  Intrauterine pregnancy at 40-3/7 weeks.  2.  Early active labor.   PLAN:  1.  Admit to birthing suites, Dr. Estanislado Pandy notified.  2.  Routine CNM orders.  3.  Declines analgesia.      Marie L. Williams, C.N.M.      Crist Fat Rivard, M.D.  Electronically Signed    MLW/MEDQ  D:  08/18/2005  T:  08/18/2005  Job:  [id number]

## 2010-12-04 LAB — CBC
HCT: 39.3
Hemoglobin: 11.3 — ABNORMAL LOW
Platelets: 182
RBC: 3.12 — ABNORMAL LOW
RBC: 3.84 — ABNORMAL LOW
WBC: 10.7 — ABNORMAL HIGH
WBC: 10.8 — ABNORMAL HIGH

## 2010-12-04 LAB — URINALYSIS, ROUTINE W REFLEX MICROSCOPIC
Nitrite: NEGATIVE
Specific Gravity, Urine: 1.01
Urobilinogen, UA: 0.2
pH: 6.5

## 2010-12-04 LAB — RPR: RPR Ser Ql: NONREACTIVE

## 2010-12-05 LAB — FETAL FIBRONECTIN: Fetal Fibronectin: NEGATIVE

## 2010-12-10 LAB — CBC
HCT: 36.6
Hemoglobin: 12.5
MCV: 101.5 — ABNORMAL HIGH
RBC: 3.61 — ABNORMAL LOW
WBC: 12.8 — ABNORMAL HIGH

## 2010-12-10 LAB — DIFFERENTIAL
Eosinophils Relative: 1
Lymphocytes Relative: 13
Lymphs Abs: 1.6
Monocytes Absolute: 0.8 — ABNORMAL HIGH
Monocytes Relative: 7

## 2011-06-25 ENCOUNTER — Encounter: Payer: Self-pay | Admitting: Obstetrics and Gynecology

## 2011-07-07 ENCOUNTER — Ambulatory Visit (INDEPENDENT_AMBULATORY_CARE_PROVIDER_SITE_OTHER): Payer: BC Managed Care – PPO | Admitting: Obstetrics and Gynecology

## 2011-07-07 ENCOUNTER — Encounter: Payer: Self-pay | Admitting: Obstetrics and Gynecology

## 2011-07-07 VITALS — BP 110/80 | Resp 16 | Ht 68.0 in | Wt 194.0 lb

## 2011-07-07 DIAGNOSIS — IMO0001 Reserved for inherently not codable concepts without codable children: Secondary | ICD-10-CM

## 2011-07-07 DIAGNOSIS — Z309 Encounter for contraceptive management, unspecified: Secondary | ICD-10-CM

## 2011-07-07 DIAGNOSIS — E663 Overweight: Secondary | ICD-10-CM

## 2011-07-07 DIAGNOSIS — Z3009 Encounter for other general counseling and advice on contraception: Secondary | ICD-10-CM

## 2011-07-07 DIAGNOSIS — R32 Unspecified urinary incontinence: Secondary | ICD-10-CM | POA: Insufficient documentation

## 2011-07-07 NOTE — Progress Notes (Signed)
Ms. [name] is a 41 y.o. year old female,G5P1223, who presents for a problem visit. She has a Mirena IUD that was placed in March of 2009.  She was to have that removed.  Subjective:  The patient was to have her Mirena removed.  She has elected to be sterilized.  Her husband declines vasectomy.  Objective:  BP 110/80  Resp 16  Ht 5\' 8"  (1.727 m)  Wt 194 lb (87.998 kg)  BMI 29.50 kg/m2   General: alert and cooperative GI: soft, non-tender; bowel sounds normal; no masses,  no organomegaly  External genitalia: normal general appearance Vaginal: normal without tenderness, induration or masses Cervix: normal appearance and IUD string visualized Adnexa: normal bimanual exam Uterus: normal size shape and consistency  Procedure:  The IUD string was identified.  It was removed without difficulty.  The device was shown to the patient.  She tolerated her procedure well.  Assessment:  Patient wants IUD removed. Sterilization consult  Plan:  The patient will use condoms and foam for now. We discussed laparoscopic tubal cautery and Essure.  The risk and benefits of both options were reviewed.  The patient will call us with her decision.  Written information was given to the patient.  Return to office prn.   Leonard Schwartz M.D.  07/07/2011 7:57 PM

## 2011-07-07 NOTE — Patient Instructions (Signed)
Sterilization, Women Sterilization is a surgical procedure. This surgery permanently prevents pregnancy in women. This can be done by tying (with or without cutting) the fallopian tubes or burning the tubes closed (tubal ligation). Tubal ligation blocks the tubes and prevents the egg from being fertilized by the sperm. Sterilization can be done by removing the ovaries that produce the egg (castration) as well. Sterilization is considered safe with very rare complications. It does not affect menstrual periods, sexual desire, or performance.  Since sterilization is considered permanent, you should not do it until you are sure you do not want to have more children. You and your partner should fully agree to have the procedure. Your decision to have the procedure should not be made when you are in a stressful situation. This can include a loss of a pregnancy, illness or death of a spouse, or divorce. There are other means of preventing unwanted pregnancies that can be used until you are completely sure you want to be sterilized. Sterilization does not protect against sexually transmitted disease. Women who had a sterilization procedure and want it reversed must know that it requires an expensive and major operation. The reversal may not be successful and has a high rate of tubal (ectopic) pregnancy that can be dangerous and require surgery. There are several ways to perform a tubal sterlization:  Laparoscopy. The abdomen is filled with a gas to see the pelvic organs. Then, a tube with a light attached is inserted into the abdomen through 2 small incisions. The fallopian tubes are blocked with a ring, clip or electrocautery to burn closed the tubes. Then, the gas is released and the small incisions are closed.   Hysteroscopy. A tube with a light is inserted in the vagina, through the cervix and then into the uterus. A spring-like instrument is inserted into the opening of the fallopian tubes. The spring causes  scaring and blocks the tubes. Other forms of contraception should be used for three months at which time an X-ray is done to be sure the tubes are blocked.   Minilaparotomy. This is done right after giving birth. A small incision is made under the belly button and the tubes are exposed. The tubes can then be burned, tied and/or cut.   Tubal ligation can be done during a Cesarean section.   Castration is a surgical procedure that removes both ovaries.  Tubal sterilization should be discussed with your caregiver to answer any concerns you or your partner might have. This meeting will help to decide for sure if the operation is safe for you and which procedure is the best one for you. You can change your mind and cancel the surgery at any time. HOME CARE INSTRUCTIONS   Follow your caregivers instructions regarding diet, rest, work, social and sexual activities and follow up appointments.   Shoulder pain is common following a laparoscopy. The pain may be relieved by lying down flat.   Only take over-the-counter or prescription medicines for pain, discomfort or fever as directed by your caregiver.   You may use lozenges for throat discomfort.   Keep the incisions covered to prevent infection.  SEEK IMMEDIATE MEDICAL CARE IF:   You develop a temperature of 102 F (38.9 C), or as your caregiver suggests.   You become dizzy or faint.   You start to feel sick to your stomach (nausea) or throw up (vomit).   You develop abdominal pain not relieved with over-the-counter medications.   You have redness and puffiness (  swelling) of the cut (incision).   You see pus draining from the incision.   You miss a menstrual period.  Document Released: 07/30/2007 Document Revised: 01/30/2011 Document Reviewed: 07/30/2007 Exit[employer] Patient Information 2012 [employer]. 

## 2011-08-18 ENCOUNTER — Telehealth: Payer: Self-pay | Admitting: Obstetrics and Gynecology

## 2011-08-18 NOTE — Telephone Encounter (Signed)
TC to pt. LM to return call regarding message. 

## 2011-08-22 NOTE — Telephone Encounter (Signed)
TC from pt.  States reviewed information regarding sterilization.  Would like to have  In office hysteroscopic procedure and is requesting referral to provider who does that procedure.

## 2011-09-05 ENCOUNTER — Telehealth: Payer: Self-pay | Admitting: Obstetrics and Gynecology

## 2011-09-05 NOTE — Telephone Encounter (Signed)
Triage/general quest. 

## 2011-09-08 ENCOUNTER — Telehealth: Payer: Self-pay

## 2011-09-08 NOTE — Telephone Encounter (Signed)
AVS PT

## 2011-09-08 NOTE — Telephone Encounter (Signed)
Pt would like a referral for sterilization. Message has been routed to Dr.Stringer to advise.  [facility] CMA

## 2011-09-12 ENCOUNTER — Telehealth: Payer: Self-pay

## 2011-09-12 NOTE — Telephone Encounter (Signed)
Tried to call pt back to let her know Per AVS she will need to see Dr.Dillard for Essure. The contact numbers we have for this pt are not working. Jeanice Lim will mail pt a letter to call the office.  [facility] CMA

## 2011-09-29 ENCOUNTER — Ambulatory Visit (INDEPENDENT_AMBULATORY_CARE_PROVIDER_SITE_OTHER): Payer: BC Managed Care – PPO | Admitting: Obstetrics and Gynecology

## 2011-09-29 ENCOUNTER — Other Ambulatory Visit: Payer: Self-pay | Admitting: Obstetrics and Gynecology

## 2011-09-29 ENCOUNTER — Encounter: Payer: Self-pay | Admitting: Obstetrics and Gynecology

## 2011-09-29 ENCOUNTER — Ambulatory Visit: Payer: BC Managed Care – PPO | Admitting: Obstetrics and Gynecology

## 2011-09-29 VITALS — BP 112/72 | Ht 68.0 in | Wt 181.0 lb

## 2011-09-29 DIAGNOSIS — Z01419 Encounter for gynecological examination (general) (routine) without abnormal findings: Secondary | ICD-10-CM

## 2011-09-29 DIAGNOSIS — Z1231 Encounter for screening mammogram for malignant neoplasm of breast: Secondary | ICD-10-CM

## 2011-09-29 NOTE — Progress Notes (Signed)
Last Pap: 05/16/08 WNL: Yes Regular Periods:yes Contraception: none ( condoms)   Monthly Breast exam:yes Tetanus<64yrs:yes Nl.Bladder Function:yes Daily BMs:yes Healthy Diet:yes Calcium:yes Mammogram:no Date of Mammogram:  Exercise:yes Have often Exercise: 5 times a week  Seatbelt: yes Abuse at home: no Stressful work:no Sigmoid-colonoscopy:  Bone Density: No PCP: [place]  Change in PMH: none  Change in ZOX:WRUE   ANNUAL GYNECOLOGIC EXAMINATION   [name] is a 41 y.o. female, A5W0981, who presents for an annual exam. See above. The patient desires sterilization.  Her husband declines vasectomy.  She now wants to consider laparoscopic tubal cautery.  Prior Hysterectomy: No    History   Social History  . Marital Status: Married    Spouse Name: N/A    Number of Children: N/A  . Years of Education: N/A   Social History Main Topics  . Smoking status: Former Games developer  . Smokeless tobacco: None  . Alcohol Use: 0.0 oz/week  . Drug Use: No  . Sexually Active: Yes    Birth Control/ Protection: Pill   Other Topics Concern  . None   Social History Narrative  . None    Menstrual cycle:   LMP: Patient's last menstrual period was 09/03/2011.             The following portions of the patient's history were reviewed and updated as appropriate: allergies, current medications, past family history, past medical history, past social history, past surgical history and problem list.  Review of Systems Pertinent items are noted in HPI. Breast:Negative for breast lump,nipple discharge or nipple retraction Gastrointestinal: Negative for abdominal pain, change in bowel habits or rectal bleeding Urinary:negative   Objective:    BP 112/72  Ht 5\' 8"  (1.727 m)  Wt 181 lb (82.101 kg)  BMI 27.52 kg/m2  LMP 09/03/2011    Weight:  Wt Readings from Last 1 Encounters:  09/29/11 181 lb (82.101 kg)          BMI: Body mass index is 27.52 kg/(m^2).  General Appearance:  Alert, appropriate appearance for age. No acute distress HEENT: Grossly normal Neck / Thyroid: Supple, no masses, nodes or enlargement Lungs: clear to auscultation bilaterally Back: No CVA tenderness Breast Exam: No masses or nodes.No dimpling, nipple retraction or discharge. Cardiovascular: Regular rate and rhythm. S1, S2, no murmur Gastrointestinal: Soft, non-tender, no masses or organomegaly  ++++++++++++++++++++++++++++++++++++++++++++++++++++++++  Pelvic Exam: External genitalia: normal general appearance Vaginal: normal without tenderness, induration or masses and relaxation: Yes Cervix: normal appearance Adnexa: normal bimanual exam Uterus: normal size, shape, and consistency Rectovaginal: normal rectal, no masses  ++++++++++++++++++++++++++++++++++++++++++++++++++++++++  Lymphatic Exam: Non-palpable nodes in neck, clavicular, axillary, or inguinal regions Neurologic: Normal speech, no tremor  Psychiatric: Alert and oriented, appropriate affect.   Wet Prep:   not applicable Urinalysis:  not applicable UPT:           Not done   Assessment:    Normal gyn exam   Overweight or obese: Yes   Pelvic relaxation: Yes  Desires laparoscopic tubal cautery   Plan:    mammogram pap smear return annually or prn Contraception:wants to schedule LTC.  Contraceptive options reviewed.  Risk and benefits discussed.   Medications prescribed: none  STD screen request: No   The updated Pap smear screening guidelines were discussed with the patient. The patient requested that I obtain a Pap smear: Yes.  Kegel exercises discussed: Yes.  Proper diet and regular exercise were reviewed.  Annual mammograms recommended starting at age 59. Proper breast care  was discussed.  Screening colonoscopy is recommended beginning at age 62.  Regular health maintenance was reviewed.  Sleep hygiene was discussed.  Mylinda Latina.D.

## 2011-09-30 ENCOUNTER — Telehealth: Payer: Self-pay | Admitting: Obstetrics and Gynecology

## 2011-09-30 LAB — PAP IG W/ RFLX HPV ASCU

## 2011-09-30 NOTE — Telephone Encounter (Signed)
LTC scheduled for 11/29/11 @ 10:30 with AVS.  BCBS effective 01/23/11.  Plan pays 80/20 after a $700 deductible. Pre-op due $101.38. -[name]

## 2011-10-02 ENCOUNTER — Ambulatory Visit
Admission: RE | Admit: 2011-10-02 | Discharge: 2011-10-02 | Disposition: A | Discharge status: Home / Self Care | Payer: BC Managed Care – PPO | Source: Ambulatory Visit | Attending: Obstetrics and Gynecology | Admitting: Obstetrics and Gynecology

## 2011-10-02 DIAGNOSIS — Z1231 Encounter for screening mammogram for malignant neoplasm of breast: Secondary | ICD-10-CM

## 2011-10-22 ENCOUNTER — Other Ambulatory Visit: Payer: Self-pay | Admitting: Obstetrics and Gynecology

## 2011-11-13 ENCOUNTER — Encounter (HOSPITAL_COMMUNITY): Payer: Self-pay | Admitting: Pharmacist

## 2011-11-18 ENCOUNTER — Encounter (HOSPITAL_COMMUNITY): Payer: Self-pay

## 2011-11-18 ENCOUNTER — Encounter (HOSPITAL_COMMUNITY)
Admission: RE | Admit: 2011-11-18 | Discharge: 2011-11-18 | Disposition: A | Discharge status: Home / Self Care | Payer: BC Managed Care – PPO | Source: Ambulatory Visit | Attending: Obstetrics and Gynecology | Admitting: Obstetrics and Gynecology

## 2011-11-18 HISTORY — DX: Unspecified osteoarthritis, unspecified site: M19.90

## 2011-11-18 HISTORY — DX: Other seasonal allergic rhinitis: J30.2

## 2011-11-18 HISTORY — DX: Headache: R51

## 2011-11-18 HISTORY — DX: Hyperlipidemia, unspecified: E78.5

## 2011-11-18 HISTORY — DX: Anemia, unspecified: D64.9

## 2011-11-18 LAB — SURGICAL PCR SCREEN: MRSA, PCR: NEGATIVE

## 2011-11-18 LAB — CBC
Hemoglobin: 13.6 g/dL (ref 12.0–15.0)
MCH: 32.6 pg (ref 26.0–34.0)
MCHC: 34.6 g/dL (ref 30.0–36.0)
RDW: 12.5 % (ref 11.5–15.5)

## 2011-11-18 NOTE — Patient Instructions (Addendum)
   Your procedure is scheduled on:  Sat., Oct 5th  Enter through the Main Entrance of [facility] at: 9 am Pick up the phone at the desk and dial [phone] and inform us of your arrival.  Please call this number if you have any problems the morning of surgery: [phone]  Remember: Do not eat food after midnight: Friday Do not drink clear liquids after: Friday Take these medicines the morning of surgery with a SIP OF WATER: none  Do not wear jewelry, make-up, or FINGER nail polish No metal in your hair or on your body. Do not wear lotions, powders, perfumes or deodorant. Do not shave 48 hours prior to surgery Do not bring valuables to the hospital. Contacts, dentures or bridgework may not be worn into surgery.  Patients discharged on the day of surgery will not be allowed to drive home.  Home with husband Jeannett Senior cell [phone].   Remember to use your hibiclens as instructed.Please shower with 1/2 bottle the evening before your surgery and the other 1/2 bottle the morning of surgery. Neck down avoiding private area.

## 2011-11-19 ENCOUNTER — Telehealth: Payer: Self-pay | Admitting: Obstetrics and Gynecology

## 2011-11-19 ENCOUNTER — Other Ambulatory Visit: Payer: Self-pay | Admitting: Obstetrics and Gynecology

## 2011-11-24 ENCOUNTER — Telehealth: Payer: Self-pay

## 2011-11-24 NOTE — Telephone Encounter (Signed)
I tried to call pt @ 10:09am  to make her aware I will consult w/AVS. No voicemail came on.  I will still consult w/ AVS regarding pt concerns about surgery ectopic pregnancy risk.  [facility] CMA

## 2011-11-27 NOTE — H&P (Signed)
Admission History and Physical Exam for a Gynecology Patient  Ms. Jillena Ryen is a 41 y.o. female, [id number], who presents for laparoscopic tubal cautery. She has been followed at the [facility] and Gynecology division of [facility]. See history below.  OB History    Grav Para Term Preterm Abortions TAB SAB Ect Mult Living   5 3 1 2 2 2    3       Past Medical History  Diagnosis Date  . AMA (advanced maternal age) multigravida 35+   . H/O varicella   . H/O rubella   . Vaginal irritation 09/26/05  . Pelvic pain in female 10/23/05    postpartum  . Hip joint pain, right 06/23/07  . Leukorrhea 2010    Physiologic  . SVD (spontaneous vaginal delivery)     x 1  . Seasonal allergies   . Hyperlipidemia     diet controlled  . Anxiety     hx ppd  . Depression     hx ppd  . Headache     otc med prn  . Arthritis     left thumb, left hip  . Anemia     hx in patient's 20's    No prescriptions prior to admission    Past Surgical History  Procedure Date  . Wisdom tooth extraction 1988  . Wrist surgery 2006    right  . Cesarean section     x 1  . Fetal surgery selective fetoscopic laser photocoagulation (sflp) w/ laparotomy     No Known Allergies  Family History: family history includes Cancer in her paternal aunt and paternal uncle; Heart disease in her paternal uncle; Hypertension in her mother and sister; and Stroke in her paternal grandmother.  Social History:  reports that she quit smoking about 15 years ago. Her smoking use included Cigarettes. She has a 5 pack-year smoking history. She has never used smokeless tobacco. She reports that she drinks alcohol. She reports that she does not use illicit drugs.  Review of systems: Normal pregnancy complaints.  Admission Physical Exam:    There is no height or weight on file to calculate BMI.  There were no vitals taken for this visit.  HEENT:                 Within normal  limits Chest:                   Clear Heart:                    Regular rate and rhythm Breasts:                No masses, skin changes, bleeding, or discharge present Abdomen:             Nontender, no masses Extremities:          Grossly normal Neurologic exam: Grossly normal  Pelvic exam:  External genitalia: normal general appearance Vaginal: normal without tenderness, induration or masses and relaxation noted Cervix: normal appearance Adnexa: normal bimanual exam Uterus: upper limits normal size Rectal: good sphincter tone and no masses  Assessment:  Desires sterilization  Plan:  The patient will undergo a tubal sterilization procedure.  She understands the indications for surgical procedure.  She accepts the risk of, but not limited to, anesthetic complications, bleeding, infections, possible damage to the surrounding organs, and possible tubal failure (17 per 1000) disease.   Yeng Perz V 11/27/2011, 7:23  PM

## 2011-11-28 ENCOUNTER — Telehealth: Payer: Self-pay | Admitting: Obstetrics and Gynecology

## 2011-11-28 NOTE — Telephone Encounter (Signed)
Pt called. Surgery discussed.  Dr. Stefano Gaul

## 2011-11-29 ENCOUNTER — Ambulatory Visit (HOSPITAL_COMMUNITY)
Admission: RE | Admit: 2011-11-29 | Discharge: 2011-11-29 | Disposition: A | Discharge status: Home / Self Care | Payer: BC Managed Care – PPO | Source: Ambulatory Visit | Attending: Obstetrics and Gynecology | Admitting: Obstetrics and Gynecology

## 2011-11-29 ENCOUNTER — Encounter (HOSPITAL_COMMUNITY): Payer: Self-pay | Admitting: *Deleted

## 2011-11-29 ENCOUNTER — Encounter (HOSPITAL_COMMUNITY)
Admission: RE | Disposition: A | Discharge status: Home / Self Care | Payer: Self-pay | Source: Ambulatory Visit | Attending: Obstetrics and Gynecology

## 2011-11-29 ENCOUNTER — Encounter (HOSPITAL_COMMUNITY): Payer: Self-pay | Admitting: Anesthesiology

## 2011-11-29 ENCOUNTER — Ambulatory Visit (HOSPITAL_COMMUNITY): Payer: BC Managed Care – PPO | Admitting: Anesthesiology

## 2011-11-29 DIAGNOSIS — Z302 Encounter for sterilization: Secondary | ICD-10-CM

## 2011-11-29 HISTORY — PX: LAPAROSCOPIC TUBAL LIGATION: SHX1937

## 2011-11-29 SURGERY — LIGATION, FALLOPIAN TUBE, LAPAROSCOPIC
Anesthesia: General | Site: Abdomen | Laterality: Bilateral | Wound class: Clean Contaminated

## 2011-11-29 MED ORDER — ROCURONIUM BROMIDE 50 MG/5ML IV SOLN
INTRAVENOUS | Status: AC
  Filled 2011-11-29: qty 1

## 2011-11-29 MED ORDER — NEOSTIGMINE METHYLSULFATE 1 MG/ML IJ SOLN
INTRAMUSCULAR | Status: DC | PRN
  Administered 2011-11-29: 3 mg via INTRAVENOUS

## 2011-11-29 MED ORDER — KETOROLAC TROMETHAMINE 30 MG/ML IJ SOLN
INTRAMUSCULAR | Status: DC | PRN
  Administered 2011-11-29: 30 mg via INTRAVENOUS

## 2011-11-29 MED ORDER — MIDAZOLAM HCL 5 MG/5ML IJ SOLN
INTRAMUSCULAR | Status: DC | PRN
  Administered 2011-11-29: 1 mg via INTRAVENOUS

## 2011-11-29 MED ORDER — LIDOCAINE HCL (CARDIAC) 20 MG/ML IV SOLN
INTRAVENOUS | Status: DC | PRN
  Administered 2011-11-29: 60 mg via INTRAVENOUS

## 2011-11-29 MED ORDER — HYDROCODONE-ACETAMINOPHEN 5-500 MG PO TABS: 1.0000 | ORAL_TABLET | ORAL | Status: DC | PRN

## 2011-11-29 MED ORDER — ROCURONIUM BROMIDE 100 MG/10ML IV SOLN
INTRAVENOUS | Status: DC | PRN
  Administered 2011-11-29: 25 mg via INTRAVENOUS

## 2011-11-29 MED ORDER — GLYCOPYRROLATE 0.2 MG/ML IJ SOLN
INTRAMUSCULAR | Status: AC
  Filled 2011-11-29: qty 4

## 2011-11-29 MED ORDER — FENTANYL CITRATE 0.05 MG/ML IJ SOLN: 25.0000 ug | INTRAMUSCULAR | Status: DC | PRN

## 2011-11-29 MED ORDER — PROMETHAZINE HCL 12.5 MG PO TABS: 12.5000 mg | ORAL_TABLET | Freq: Four times a day (QID) | ORAL | Status: DC | PRN

## 2011-11-29 MED ORDER — PROPOFOL 10 MG/ML IV BOLUS
INTRAVENOUS | Status: DC | PRN
  Administered 2011-11-29: 200 mg via INTRAVENOUS

## 2011-11-29 MED ORDER — FENTANYL CITRATE 0.05 MG/ML IJ SOLN
INTRAMUSCULAR | Status: AC
  Filled 2011-11-29: qty 5

## 2011-11-29 MED ORDER — BUPIVACAINE-EPINEPHRINE 0.5% -1:200000 IJ SOLN
INTRAMUSCULAR | Status: DC | PRN
  Administered 2011-11-29: 10 mL

## 2011-11-29 MED ORDER — BUPIVACAINE-EPINEPHRINE (PF) 0.5% -1:200000 IJ SOLN
INTRAMUSCULAR | Status: AC
  Filled 2011-11-29: qty 10

## 2011-11-29 MED ORDER — KETOROLAC TROMETHAMINE 60 MG/2ML IM SOLN
INTRAMUSCULAR | Status: DC | PRN
  Administered 2011-11-29: 30 mg via INTRAMUSCULAR

## 2011-11-29 MED ORDER — FENTANYL CITRATE 0.05 MG/ML IJ SOLN
INTRAMUSCULAR | Status: DC | PRN
  Administered 2011-11-29: 100 ug via INTRAVENOUS
  Administered 2011-11-29 (×2): 50 ug via INTRAVENOUS

## 2011-11-29 MED ORDER — ONDANSETRON HCL 4 MG/2ML IJ SOLN
INTRAMUSCULAR | Status: AC
  Filled 2011-11-29: qty 2

## 2011-11-29 MED ORDER — IBUPROFEN 800 MG PO TABS: 800.0000 mg | ORAL_TABLET | Freq: Three times a day (TID) | ORAL | Status: DC | PRN

## 2011-11-29 MED ORDER — MIDAZOLAM HCL 2 MG/2ML IJ SOLN
INTRAMUSCULAR | Status: AC
  Filled 2011-11-29: qty 2

## 2011-11-29 MED ORDER — KETOROLAC TROMETHAMINE 60 MG/2ML IM SOLN
INTRAMUSCULAR | Status: AC
  Filled 2011-11-29: qty 2

## 2011-11-29 MED ORDER — LACTATED RINGERS IV SOLN
INTRAVENOUS | Status: DC
  Administered 2011-11-29: 11:00:00 via INTRAVENOUS
  Administered 2011-11-29: 125 mL/h via INTRAVENOUS

## 2011-11-29 MED ORDER — ONDANSETRON HCL 4 MG/2ML IJ SOLN
INTRAMUSCULAR | Status: DC | PRN
  Administered 2011-11-29: 4 mg via INTRAVENOUS

## 2011-11-29 MED ORDER — GLYCOPYRROLATE 0.2 MG/ML IJ SOLN
INTRAMUSCULAR | Status: DC | PRN
  Administered 2011-11-29: 0.6 mg via INTRAVENOUS
  Administered 2011-11-29: 0.1 mg via INTRAVENOUS

## 2011-11-29 MED ORDER — NEOSTIGMINE METHYLSULFATE 1 MG/ML IJ SOLN
INTRAMUSCULAR | Status: AC
  Filled 2011-11-29: qty 10

## 2011-11-29 MED ORDER — DEXAMETHASONE SODIUM PHOSPHATE 10 MG/ML IJ SOLN
INTRAMUSCULAR | Status: AC
  Filled 2011-11-29: qty 1

## 2011-11-29 MED ORDER — LIDOCAINE HCL (CARDIAC) 20 MG/ML IV SOLN
INTRAVENOUS | Status: AC
  Filled 2011-11-29: qty 5

## 2011-11-29 MED ORDER — PROPOFOL 10 MG/ML IV EMUL
INTRAVENOUS | Status: AC
  Filled 2011-11-29: qty 20

## 2011-11-29 MED ORDER — DEXAMETHASONE SODIUM PHOSPHATE 10 MG/ML IJ SOLN
INTRAMUSCULAR | Status: DC | PRN
  Administered 2011-11-29: 10 mg via INTRAVENOUS

## 2011-11-29 SURGICAL SUPPLY — 14 items
CATH ROBINSON RED A/P 16FR (CATHETERS) ×2 IMPLANT
CHLORAPREP W/TINT 26ML (MISCELLANEOUS) ×2 IMPLANT
CLOTH BEACON ORANGE TIMEOUT ST (SAFETY) ×2 IMPLANT
DRSG COVADERM PLUS 2X2 (GAUZE/BANDAGES/DRESSINGS) ×2 IMPLANT
GLOVE BIOGEL PI IND STRL 8.5 (GLOVE) ×1 IMPLANT
GLOVE BIOGEL PI INDICATOR 8.5 (GLOVE) ×1
GLOVE ECLIPSE 8.0 STRL XLNG CF (GLOVE) ×4 IMPLANT
GOWN PREVENTION PLUS LG XLONG (DISPOSABLE) ×4 IMPLANT
GOWN PREVENTION PLUS XXLARGE (GOWN DISPOSABLE) ×2 IMPLANT
PACK LAPAROSCOPY BASIN (CUSTOM PROCEDURE TRAY) ×2 IMPLANT
SUT MNCRL AB 4-0 PS2 18 (SUTURE) ×2 IMPLANT
SUT VICRYL 0 UR6 27IN ABS (SUTURE) ×2 IMPLANT
TOWEL OR 17X24 6PK STRL BLUE (TOWEL DISPOSABLE) ×4 IMPLANT
WATER STERILE IRR 1000ML POUR (IV SOLUTION) ×2 IMPLANT

## 2011-11-29 NOTE — Progress Notes (Signed)
The patient was interviewed and examined today.  The previously documented history and physical examination was reviewed. There are no changes. The operative procedure was reviewed. The risks and benefits were outlined again. The specific risks include, but are not limited to, anesthetic complications, bleeding, infections, and possible damage to the surrounding organs. The patient's questions were answered.  We are ready to proceed as outlined. The likelihood of the patient achieving the goals of this procedure is very likely.   BP 108/72  Pulse 81  Temp 98.1 F (36.7 C) (Oral)  Resp 18  SpO2 100%  LMP 11/26/2011  CBC    Component Value Date/Time   WBC 9.5 11/18/2011 1510   RBC 4.17 11/18/2011 1510   HGB 13.6 11/18/2011 1510   HCT 39.3 11/18/2011 1510   PLT 264 11/18/2011 1510   MCV 94.2 11/18/2011 1510   MCH 32.6 11/18/2011 1510   MCHC 34.6 11/18/2011 1510   RDW 12.5 11/18/2011 1510   LYMPHSABS 1.6 09/07/2006 1627   MONOABS 0.8* 09/07/2006 1627   EOSABS 0.1 09/07/2006 1627   BASOSABS 0.0 09/07/2006 1627      Leonard Schwartz, M.D.

## 2011-11-29 NOTE — Anesthesia Procedure Notes (Signed)
Procedure Name: Intubation Date/Time: 11/29/2011 10:39 AM Performed by: Graciela Husbands Pre-anesthesia Checklist: Suction available, Emergency Drugs available, Timeout performed, Patient identified and Patient being monitored Patient Re-evaluated:Patient Re-evaluated prior to inductionOxygen Delivery Method: Circle system utilized Preoxygenation: Pre-oxygenation with 100% oxygen Intubation Type: IV induction Ventilation: Mask ventilation without difficulty Laryngoscope Size: Mac and 3 Grade View: Grade I Tube type: Oral Tube size: 7.0 mm Number of attempts: 1 Airway Equipment and Method: Stylet Placement Confirmation: ETT inserted through vocal cords under direct vision,  positive ETCO2 and breath sounds checked- equal and bilateral Secured at: 21 cm Tube secured with: Tape Dental Injury: Teeth and Oropharynx as per pre-operative assessment

## 2011-11-29 NOTE — Anesthesia Postprocedure Evaluation (Signed)
  Anesthesia Post-op Note  Patient: [name]  Procedure(s) Performed: Procedure(s) (LRB) with comments: LAPAROSCOPIC TUBAL LIGATION (Bilateral) - Laparoscopic tubal ligation with cautery.  Patient is awake and responsive. Pain and nausea are reasonably well controlled. Vital signs are stable and clinically acceptable. Oxygen saturation is clinically acceptable. There are no apparent anesthetic complications at this time. Patient is ready for discharge.

## 2011-11-29 NOTE — Anesthesia Preprocedure Evaluation (Signed)

## 2011-11-29 NOTE — Transfer of Care (Signed)
Immediate Anesthesia Transfer of Care Note  Patient: [name]  Procedure(s) Performed: Procedure(s) (LRB) with comments: LAPAROSCOPIC TUBAL LIGATION (Bilateral) - Laparoscopic tubal ligation with cautery.  Patient Location: PACU  Anesthesia Type: General  Level of Consciousness: awake, alert  and oriented  Airway & Oxygen Therapy: Patient Spontanous Breathing and Patient connected to nasal cannula oxygen  Post-op Assessment: Report given to PACU RN and Post -op Vital signs reviewed and stable  Post vital signs: Reviewed and stable  Complications: No apparent anesthesia complications

## 2011-11-29 NOTE — Op Note (Signed)
OPERATIVE NOTE  [name]  DOB:    1970/07/20  MRN:    [id number]  CSN:    [id number]  Date of Surgery:  11/29/2011  Preoperative Diagnosis:  Desires sterilization  Postoperative Diagnosis:  Desires sterilization   Procedure:  Laparoscopic Tubal Cautery  Surgeon:  Leonard Schwartz, M.D.  Assistant:  None  Anesthetic:  General  Disposition:  Ms. [name] is a 41 year old female,G5P1223, who presents for a tubal sterilization procedure. She understands the indications for her operation as well as the alternative treatment options. She accepts the risk of, but not limited to, anesthetic complications, bleeding, infections, possible damage to the surrounding organs, and possible tubal failure (17 per 1000).  Findings:  The uterus, fallopian tubes, and ovaries were normal. The bowel and the liver appeared normal.  Procedure:  The patient was taken to the operating room where a general anesthetic was given. The patient's abdomen, perineum, and vagina were prepped with sterile solution. The bladder was drained of urine. An examination under anesthesia was performed. A Hulka tenaculum was placed inside the uterus. The patient was sterilely draped. The subumbilical area was injected with half percent Marcaine with epinephrine. A subumbilical incision was made and the Veress needle was inserted into the abdominal cavity without difficulty. Proper placement was confirmed using the fluid drop test. A pneumoperitoneum was obtained. The laparoscopic trocar and the laparoscope were substituted for the Veress needle. The pelvic organs were inspected with findings as mentioned above. The right fallopian tube was identified and followed to the fimbriated end. The right fallopian tube was cauterized in its proximal portion in several areas until the tube was completely desiccated. Hemostasis was adequate. An identical procedure was carried out on the opposite side.  Again hemostasis was adequate. The pneumoperitoneum was allowed to escape. All instruments were removed. The subumbilical fascia was closed using 0 Vicryl. The skin was reapproximated using 3-0 Monocryl. Sponge and needle counts were correct. The estimated blood loss was less than 2 cc. The patient tolerated her procedure well. She was awakened from her anesthetic without difficulty. She was transported to the recovery room in stable condition.  Followup instructions:  The patient will return to see Dr. Stefano Gaul in 2 weeks. She was given a copy of the postoperative instructions as prepared by the [facility] of [facility] for patients who've undergone laparoscopy.  Discharge medications:  Motrin 800 mg tablets            One tablet every 8 hours as needed for moderate pain. Vicodin 5/500 tablets             One or two tablets every 4 hours as needed for severe pain. Phenergan 12.5 mg tablets   One tablet every 6 hours as needed for nausea.  Leonard Schwartz, M.D.

## 2011-12-01 ENCOUNTER — Encounter (HOSPITAL_COMMUNITY): Payer: Self-pay | Admitting: Obstetrics and Gynecology

## 2011-12-03 ENCOUNTER — Telehealth: Payer: Self-pay | Admitting: Obstetrics and Gynecology

## 2011-12-03 NOTE — Telephone Encounter (Signed)
Pt called. Doing well.  Dr. Stefano Gaul

## 2011-12-15 ENCOUNTER — Encounter: Payer: Self-pay | Admitting: Obstetrics and Gynecology

## 2011-12-15 ENCOUNTER — Ambulatory Visit (INDEPENDENT_AMBULATORY_CARE_PROVIDER_SITE_OTHER): Payer: BC Managed Care – PPO | Admitting: Obstetrics and Gynecology

## 2011-12-15 VITALS — BP 122/80 | Temp 98.3°F | Wt 180.0 lb

## 2011-12-15 DIAGNOSIS — Z302 Encounter for sterilization: Secondary | ICD-10-CM

## 2011-12-15 NOTE — Progress Notes (Signed)
HISTORY OF PRESENT ILLNESS  Ms. [name] is a 41 y.o. year old female,G5P1223, who presents for a problem visit. She had a laparoscopic tubal cautery on November 29, 2011.  Operative findings included normal female organs.  Subjective:  She is doing well at this point.  Objective:  BP 122/80  Temp 98.3 F (36.8 C) (Oral)  Wt 180 lb (81.647 kg)  LMP 11/26/2011   General: no distress GI: incision: clean, dry and intact and soft and nontender  External genitalia: normal general appearance Vaginal: relaxation noted Cervix: nontender Adnexa: normal bimanual exam Uterus: nontender, upper limits normal size  Assessment:  Doing well status post laparoscopic tubal cautery  Plan:  Return to normal activities.  Return to office in 10 month(s) for annual exam.   Leonard Schwartz M.D.  12/15/2011 4:15 PM    DATE OF SURGERY: 11-29-2011 TYPE OF SURGERY:  Laparoscopic Tubal Cautery 'LTC" PAIN:No VAG BLEEDING: no VAG DISCHARGE: no NORMAL GI FUNCTN: yes NORMAL GU FUNCTN: yes

## 2012-09-10 ENCOUNTER — Other Ambulatory Visit: Payer: Self-pay

## 2012-09-10 DIAGNOSIS — Z1231 Encounter for screening mammogram for malignant neoplasm of breast: Secondary | ICD-10-CM

## 2012-09-15 ENCOUNTER — Ambulatory Visit (INDEPENDENT_AMBULATORY_CARE_PROVIDER_SITE_OTHER): Payer: BC Managed Care – PPO | Admitting: Physician Assistant

## 2012-09-15 ENCOUNTER — Encounter: Payer: Self-pay | Admitting: Physician Assistant

## 2012-09-15 VITALS — BP 130/80 | HR 76 | Temp 98.3°F | Resp 18 | Ht 67.5 in | Wt 176.0 lb

## 2012-09-15 DIAGNOSIS — M7070 Other bursitis of hip, unspecified hip: Secondary | ICD-10-CM

## 2012-09-15 DIAGNOSIS — M76899 Other specified enthesopathies of unspecified lower limb, excluding foot: Secondary | ICD-10-CM

## 2012-09-15 NOTE — Progress Notes (Signed)
   Patient ID: Saher Davee MRN: [id number], DOB: [birth year], 42 y.o. Date of Encounter: 09/15/2012, 5:10 PM    Chief Complaint:  Chief Complaint  Patient presents with  . new pt, est care    needs rx med recommended by Phy Therapist     HPI: 42 y.o. year old white female here to establish PCP. Says she has been told she needs to have a PCP. Also, she has been seeing a PT for bilateral hip bursitis. She has a Rx form filled out by PT ut it requires a provider signature for agreement/approval.   She sees Gyn annually. She has had mammogram x 2 years so far.  She had CPE with Lipids and other labs just 2-3 years ago. Things were normal. She says she is actually eating better and exercising more now than she was then.   She is married. Stay at [facility]. Home Schools. Has 3 boys- 43 y/o and twins 43 y/o.     Home Meds: See attached medication section for any medications that were entered at today's visit. The computer does not put those onto this list.The following list is a list of meds entered prior to today's visit.   Current Outpatient Prescriptions on File Prior to Visit  Medication Sig Dispense Refill  . Calcium Carbonate-Vitamin D (CALCIUM 600+D) 600-400 MG-UNIT per tablet Take 1 tablet by mouth daily.      . cetirizine (ZYRTEC) 10 MG tablet Take 10 mg by mouth daily as needed. For congestion      . ibuprofen (ADVIL,MOTRIN) 800 MG tablet Take 1 tablet (800 mg total) by mouth every 8 (eight) hours as needed for pain.  50 tablet  1  . Multiple Vitamin (MULTIVITAMIN WITH MINERALS) TABS Take 1 tablet by mouth daily.       No current facility-administered medications on file prior to visit.    Allergies: No Known Allergies    Review of Systems: See HPI for pertinent ROS. All other ROS negative.    Physical Exam: Blood pressure 130/80, pulse 76, temperature 98.3 F (36.8 C), temperature source Oral, resp. rate 18, height 5' 7.5" (1.715 m), weight 176 lb (79.833 kg)., Body  mass index is 27.14 kg/(m^2). General: WNWD WF. Appears in no acute distress. Lungs: Clear bilaterally to auscultation without wheezes, rales, or rhonchi. Breathing is unlabored. Heart: Regular rhythm. No murmurs, rubs, or gallops. Msk:  Strength and tone normal for age. Extremities/Skin: Warm and dry.  No edema. No rashes or suspicious lesions. Neuro: Alert and oriented X 3. Moves all extremities spontaneously. Gait is normal. CNII-XII grossly in tact. Psych:  Responds to questions appropriately with a normal affect.     ASSESSMENT AND PLAN:  42 y.o. year old female with  1. Hip bursitis, Bilaterally F/U  With Physical Therapy  Can wait to f/u for CPE, Fasting labs in 1 year.   [address] [place], [place], [facility] 09/15/2012 5:10 PM

## 2012-10-07 ENCOUNTER — Ambulatory Visit
Admission: RE | Admit: 2012-10-07 | Discharge: 2012-10-07 | Disposition: A | Discharge status: Home / Self Care | Payer: BC Managed Care – PPO | Source: Ambulatory Visit

## 2012-10-07 DIAGNOSIS — Z1231 Encounter for screening mammogram for malignant neoplasm of breast: Secondary | ICD-10-CM

## 2012-10-15 ENCOUNTER — Ambulatory Visit: Payer: BC Managed Care – PPO

## 2013-11-18 ENCOUNTER — Other Ambulatory Visit: Payer: Self-pay

## 2013-11-18 DIAGNOSIS — Z1231 Encounter for screening mammogram for malignant neoplasm of breast: Secondary | ICD-10-CM

## 2013-11-29 ENCOUNTER — Ambulatory Visit
Admission: RE | Admit: 2013-11-29 | Discharge: 2013-11-29 | Disposition: A | Discharge status: Home / Self Care | Payer: BC Managed Care – PPO | Source: Ambulatory Visit

## 2013-11-29 DIAGNOSIS — Z1231 Encounter for screening mammogram for malignant neoplasm of breast: Secondary | ICD-10-CM

## 2013-12-26 ENCOUNTER — Encounter: Payer: Self-pay | Admitting: Physician Assistant

## 2014-11-20 ENCOUNTER — Other Ambulatory Visit: Payer: Self-pay

## 2014-11-20 DIAGNOSIS — Z1231 Encounter for screening mammogram for malignant neoplasm of breast: Secondary | ICD-10-CM

## 2014-11-24 LAB — HM PAP SMEAR: HM PAP: NORMAL

## 2014-11-27 ENCOUNTER — Encounter: Payer: Self-pay | Admitting: Family Medicine

## 2017-07-02 ENCOUNTER — Ambulatory Visit (INDEPENDENT_AMBULATORY_CARE_PROVIDER_SITE_OTHER): Payer: BLUE CROSS/BLUE SHIELD | Admitting: Physician Assistant

## 2017-07-02 ENCOUNTER — Other Ambulatory Visit: Payer: Self-pay

## 2017-07-02 ENCOUNTER — Encounter: Payer: Self-pay | Admitting: Physician Assistant

## 2017-07-02 VITALS — BP 110/76 | HR 70 | Temp 98.1°F | Resp 14 | Ht 67.5 in | Wt 182.6 lb

## 2017-07-02 DIAGNOSIS — Z0000 Encounter for general adult medical examination without abnormal findings: Secondary | ICD-10-CM | POA: Diagnosis not present

## 2017-07-02 DIAGNOSIS — Z23 Encounter for immunization: Secondary | ICD-10-CM

## 2017-07-02 NOTE — Addendum Note (Signed)
Addended by: Phineas Semen A on: 07/02/2017 10:57 AM   Modules accepted: Orders

## 2017-07-02 NOTE — Progress Notes (Signed)
Patient ID: [name] MRN: [id number], DOB: Aug 05, 1970, 47 y.o. Date of Encounter: 07/02/2017,   Chief Complaint: Establish Care, Physical (CPE)  HPI: 47 y.o. y/o female here to Establish PCP and for CPE.   (She actually had one visit with me back in 2014.)  She states that she has been going to the Gynecologist annually.  Goes to [facility]-- Dr. Stefano Gaul.   States that she has been going there annually but otherwise has never had a PCP or any other type of medical provider. Reports that her kids are transferring their care here to this office because their pediatrician moved across town.   Decided that since her children are going to be establishing care here, she should also go ahead and get established with a PCP here.  She has twins who are 48 years old and another son who is almost 48.   States that they are all boys.   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   States that they all play baseball.  She has no specific concerns to address today.  Is agreeable to go ahead and do CPE today. She is not fasting but states that she could come fasting on May 20 which is when her kids have their appointment scheduled.   She reports that she has had no significant medical history. Has no chronic medical problems other than being overweight.    Review of Systems: Consitutional: No fever, chills, fatigue, night sweats, lymphadenopathy. No significant/unexplained weight changes. Eyes: No visual changes, eye redness, or discharge. ENT/Mouth: No ear pain, sore throat, nasal drainage, or sinus pain. Cardiovascular: No chest pressure,heaviness, tightness or squeezing, even with exertion. No increased shortness of breath or dyspnea on exertion.No palpitations, edema, orthopnea, PND. Respiratory: No cough, hemoptysis, SOB, or wheezing. Gastrointestinal: No anorexia, dysphagia, reflux, pain, nausea, vomiting, hematemesis, diarrhea, constipation, BRBPR, or melena. Breast: No mass, nodules,  bulging, or retraction. No skin changes or inflammation. No nipple discharge. No lymphadenopathy. Genitourinary: No dysuria, hematuria, incontinence, vaginal discharge, pruritis, burning, abnormal bleeding, or pain. Musculoskeletal: No decreased ROM, No joint pain or swelling. No significant pain in neck, back, or extremities. Skin: No rash, pruritis, or concerning lesions. Neurological: No headache, dizziness, syncope, seizures, tremors, memory loss, coordination problems, or paresthesias. Psychological: No anxiety, depression, hallucinations, SI/HI. Endocrine: No polydipsia, polyphagia, polyuria, or known diabetes.No increased fatigue. No palpitations/rapid heart rate. No significant/unexplained weight change. All other systems were reviewed and are otherwise negative.  Past Medical History:  Diagnosis Date  . AMA (advanced maternal age) multigravida 35+   . Anemia    hx in patient's 20's  . Anxiety    hx ppd  . Arthritis    left thumb, left hip  . Arthritis    cortisone injection left thumb 3 wks ago  . Depression    hx ppd  . H/O rubella   . H/O varicella   . Headache(784.0)    otc med prn  . Hip joint pain, right 06/23/07  . Hyperlipidemia    diet controlled  . Leukorrhea 2010   Physiologic  . Pelvic pain in female 10/23/05   postpartum  . Seasonal allergies   . SVD (spontaneous vaginal delivery)    x 1  . Vaginal irritation 09/26/05     Past Surgical History:  Procedure Laterality Date  . CESAREAN SECTION     x 1  . FETAL SURGERY SELECTIVE FETOSCOPIC LASER PHOTOCOAGULATION (SFLP) W/ LAPAROTOMY    . LAPAROSCOPIC TUBAL LIGATION  11/29/2011   Procedure:  LAPAROSCOPIC TUBAL LIGATION;  Surgeon: Kirkland Hun, MD;  Location: [facility];  Service: Gynecology;  Laterality: Bilateral;  Laparoscopic tubal ligation with cautery.  . TUBAL LIGATION    . WISDOM TOOTH EXTRACTION  1988  . WRIST SURGERY  2006   right    Home Meds:  Outpatient Medications Prior to Visit  Medication  Sig Dispense Refill  . Calcium Carbonate-Vitamin D (CALCIUM 600+D) 600-400 MG-UNIT per tablet Take 1 tablet by mouth daily.    . Multiple Vitamin (MULTIVITAMIN WITH MINERALS) TABS Take 1 tablet by mouth daily.    . cetirizine (ZYRTEC) 10 MG tablet Take 10 mg by mouth daily as needed. For congestion    . ibuprofen (ADVIL,MOTRIN) 800 MG tablet Take 1 tablet (800 mg total) by mouth every 8 (eight) hours as needed for pain. 50 tablet 1   No facility-administered medications prior to visit.     Allergies: No Known Allergies  Social History   Socioeconomic History  . Marital status: Married    Spouse name: Not on file  . Number of children: Not on file  . Years of education: Not on file  . Highest education level: Not on file  Occupational History  . Not on file  Social Needs  . Financial resource strain: Not on file  . Food insecurity:    Worry: Not on file    Inability: Not on file  . Transportation needs:    Medical: Not on file    Non-medical: Not on file  Tobacco Use  . Smoking status: Former Smoker    Packs/day: 1.00    Years: 5.00    Pack years: 5.00    Types: Cigarettes    Last attempt to quit: 02/25/1996    Years since quitting: 21.3  . Smokeless tobacco: Never Used  Substance and Sexual Activity  . Alcohol use: Yes    Alcohol/week: 0.0 oz    Comment: socially  . Drug use: No  . Sexual activity: Yes    Birth control/protection: IUD    Comment: IUD was removed 09/2011  Lifestyle  . Physical activity:    Days per week: Not on file    Minutes per session: Not on file  . Stress: Not on file  Relationships  . Social connections:    Talks on phone: Not on file    Gets together: Not on file    Attends religious service: Not on file    Active member of club or organization: Not on file    Attends meetings of clubs or organizations: Not on file    Relationship status: Not on file  . Intimate partner violence:    Fear of current or ex partner: Not on file     Emotionally abused: Not on file    Physically abused: Not on file    Forced sexual activity: Not on file  Other Topics Concern  . Not on file  Social History Narrative  . Not on file    Family History  Problem Relation Age of Onset  . Hypertension Mother   . Hypertension Sister   . Cancer Paternal Aunt        lung  . Heart disease Paternal Uncle   . Cancer Paternal Uncle   . Stroke Paternal Grandmother     Physical Exam: Blood pressure 110/76, pulse 70, temperature 98.1 F (36.7 C), temperature source Oral, resp. rate 14, height 5' 7.5" (1.715 m), weight 82.8 kg (182 lb 9.6 oz), last menstrual period 06/18/2017, SpO2  98 %., Body mass index is 28.18 kg/m. General: Well developed, well nourished WF. Appears in no acute distress. HEENT: Normocephalic, atraumatic. Conjunctiva pink, sclera non-icteric. Pupils 2 mm constricting to 1 mm, round, regular, and equally reactive to light and accomodation. EOMI. Internal auditory canal clear. TMs with good cone of light and without pathology. Nasal mucosa pink. Nares are without discharge. No sinus tenderness. Oral mucosa pink. Neck: Supple. Trachea midline. No thyromegaly. Full ROM. No lymphadenopathy.No Carotid Bruits. Lungs: Clear to auscultation bilaterally without wheezes, rales, or rhonchi. Breathing is of normal effort and unlabored. Cardiovascular: RRR with S1 S2. No murmurs, rubs, or gallops. Distal pulses 2+ symmetrically. No carotid or abdominal bruits. Breast: Per Gyn Abdomen: Soft, non-tender, non-distended with normoactive bowel sounds. No hepatosplenomegaly or masses. No rebound/guarding. No CVA tenderness. No hernias.  Genitourinary:  Per Gyn Musculoskeletal: Full range of motion and 5/5 strength throughout. Skin: Warm and moist without erythema, ecchymosis, wounds, or rash. Neuro: A+Ox3. CN II-XII grossly intact. Moves all extremities spontaneously. Full sensation throughout. Normal gait.  Psych:  Responds to questions  appropriately with a normal affect.   Assessment/Plan:  47 y.o. y/o female here for CPE  1. Encounter for medical examination to establish care  2. Encounter for preventive health examination  A. Screening Labs: She is not fasting today.  She will come fasting and do labs on the morning that her kids have their appointments here on May 20. - CBC with Differential/Platelet; Future - COMPLETE METABOLIC PANEL WITH GFR; Future - Lipid panel; Future - TSH; Future   B. Pap: --Per Gyn  C. Screening Mammogram: --Per Gyn  D. DEXA/BMD:  --Does not need this until closer to age 58  E. Colorectal Cancer Screening: --No indication to require this until age 58  F. Immunizations:  Influenza:-------------N/A Tetanus:-------------Last Tdap > 10 years ago.  Agreeable to update today.  Give Tdap now -- 07/02/2017. Pneumococcal:--- She has no indication to require pneumonia vaccine until age 75 Shingrix:--------------Discuss at age 46  Signed, Shon Hale [place], [place], [facility] 07/02/2017 10:12 AM

## 2017-07-13 ENCOUNTER — Other Ambulatory Visit: Payer: BLUE CROSS/BLUE SHIELD

## 2017-07-13 DIAGNOSIS — Z0000 Encounter for general adult medical examination without abnormal findings: Secondary | ICD-10-CM

## 2017-07-14 LAB — COMPLETE METABOLIC PANEL WITH GFR
AG Ratio: 1.5 (calc) (ref 1.0–2.5)
ALBUMIN MSPROF: 4.4 g/dL (ref 3.6–5.1)
ALT: 19 U/L (ref 6–29)
AST: 17 U/L (ref 10–35)
Alkaline phosphatase (APISO): 59 U/L (ref 33–115)
BUN: 19 mg/dL (ref 7–25)
CO2: 24 mmol/L (ref 20–32)
CREATININE: 0.7 mg/dL (ref 0.50–1.10)
Calcium: 9.5 mg/dL (ref 8.6–10.2)
Chloride: 105 mmol/L (ref 98–110)
GFR, EST AFRICAN AMERICAN: 120 mL/min/{1.73_m2} (ref >=60)
GFR, EST NON AFRICAN AMERICAN: 103 mL/min/{1.73_m2} (ref >=60)
Globulin: 2.9 g/dL (calc) (ref 1.9–3.7)
Glucose, Bld: 111 mg/dL — ABNORMAL HIGH (ref 65–99)
Potassium: 4.3 mmol/L (ref 3.5–5.3)
Sodium: 138 mmol/L (ref 135–146)
TOTAL PROTEIN: 7.3 g/dL (ref 6.1–8.1)
Total Bilirubin: 0.6 mg/dL (ref 0.2–1.2)

## 2017-07-14 LAB — CBC WITH DIFFERENTIAL/PLATELET
BASOS ABS: 81 {cells}/uL (ref 0–200)
Basophils Relative: 1.3 %
EOS ABS: 149 {cells}/uL (ref 15–500)
Eosinophils Relative: 2.4 %
HCT: 40.8 % (ref 35.0–45.0)
Hemoglobin: 14.2 g/dL (ref 11.7–15.5)
Lymphs Abs: 2511 cells/uL (ref 850–3900)
MCH: 33.5 pg — AB (ref 27.0–33.0)
MCHC: 34.8 g/dL (ref 32.0–36.0)
MCV: 96.2 fL (ref 80.0–100.0)
MPV: 11.2 fL (ref 7.5–12.5)
Monocytes Relative: 11.7 %
NEUTROS PCT: 44.1 %
Neutro Abs: 2734 cells/uL (ref 1500–7800)
Platelets: 279 10*3/uL (ref 140–400)
RBC: 4.24 10*6/uL (ref 3.80–5.10)
RDW: 12 % (ref 11.0–15.0)
TOTAL LYMPHOCYTE: 40.5 %
WBC: 6.2 10*3/uL (ref 3.8–10.8)
WBCMIX: 725 {cells}/uL (ref 200–950)

## 2017-07-14 LAB — TSH: TSH: 1.9 m[IU]/L

## 2017-07-14 LAB — LIPID PANEL
CHOL/HDL RATIO: 4.9 (calc) (ref <5.0)
Cholesterol: 271 mg/dL — ABNORMAL HIGH (ref <200)
HDL: 55 mg/dL (ref >50)
LDL CHOLESTEROL (CALC): 185 mg/dL — AB
Non-HDL Cholesterol (Calc): 216 mg/dL (calc) — ABNORMAL HIGH (ref <130)
TRIGLYCERIDES: 163 mg/dL — AB (ref <150)

## 2017-10-29 ENCOUNTER — Encounter: Payer: Self-pay | Admitting: Physician Assistant

## 2017-10-29 ENCOUNTER — Ambulatory Visit: Payer: BLUE CROSS/BLUE SHIELD | Admitting: Physician Assistant

## 2017-10-29 VITALS — BP 118/80 | HR 72 | Temp 98.7°F | Resp 16 | Wt 188.2 lb

## 2017-10-29 DIAGNOSIS — G4459 Other complicated headache syndrome: Secondary | ICD-10-CM | POA: Diagnosis not present

## 2017-10-29 DIAGNOSIS — Z8249 Family history of ischemic heart disease and other diseases of the circulatory system: Secondary | ICD-10-CM | POA: Diagnosis not present

## 2017-10-29 DIAGNOSIS — G4489 Other headache syndrome: Secondary | ICD-10-CM | POA: Diagnosis not present

## 2017-10-29 NOTE — Progress Notes (Signed)
Patient ID: Ernestyne Caldwell MRN: [id number], DOB: [birth year], 47 y.o. Date of Encounter: 10/29/2017, 2:23 PM    Chief Complaint:  Chief Complaint  Patient presents with  . Headache    Patient has c/o pains in head. Family hx of anureysm      HPI: 47 y.o. year old female presents with above.    She reports that her mother--- had a cerebral aneurysm ----which was found incidentally on imaging--- at age 18---- which did require treatment.  Patient reports that she has occasionally had a mild headache that she thought may be a mild migraine but would occur very rarely -- only about 1 per year.   Says that had occurred for a few years but those headaches were less frequent. States that recently she has been feeling a pulsating sensation on the left side of her head at times. States this has occurred more frequently and for longer duration here recently. Reports that about 2 weeks ago on a Saturday she was in the yard doing some light gardening.   Says that she was not really hot and was not doing anything real strenuous at that time.   However, developed a pulsating sensation on the left side of her head that went on intermittently for hours.   Reports that it eventually resolved spontaneously without any treatment. She had no associated nausea or vomiting. She had no associated changes in vision. She had no associated photophobia.  She states that in general she can see no pattern in relation to when these headaches occur.  No pattern regarding dietary intake or sleep patterns.  Asked if she ever develops this pulsating headache with heavy lifting or strenuous physical activity.  She reports that she has not. She reports that she runs 2 miles either 4 or 5 times per week and that she has had no pulsation and no headache with this exercise. Reports that she has had no headache or pulsating headache when lifting or doing any type of Valsalva.  She has noticed no neurologic  deficits/neurologic changes.  No slurred speech.  No weakness in any extremity.  No abnormalities with balance or gait.     Home Meds:   Outpatient Medications Prior to Visit  Medication Sig Dispense Refill  . Calcium Carbonate-Vitamin D (CALCIUM 600+D) 600-400 MG-UNIT per tablet Take 1 tablet by mouth daily.    . Multiple Vitamin (MULTIVITAMIN WITH MINERALS) TABS Take 1 tablet by mouth daily.     No facility-administered medications prior to visit.     Allergies: No Known Allergies    Review of Systems: See HPI for pertinent ROS. All other ROS negative.    Physical Exam: Blood pressure 118/80, pulse 72, temperature 98.7 F (37.1 C), temperature source Oral, resp. rate 16, weight 85.4 kg, SpO2 98 %., Body mass index is 29.05 kg/m. General:  WNWD WF. Appears in no acute distress. HEENT: Normocephalic, atraumatic, eyes without discharge, sclera non-icteric, nares are without discharge. Bilateral auditory canals clear, TM's are without perforation, pearly grey and translucent with reflective cone of light bilaterally. No tenderness with percussion of left temporal region--- no sign of temporal arteritis.  She points to area superior / posterior to left ear as area of pulsatile headache.  Neck: Supple. No thyromegaly. No lymphadenopathy. Lungs: Clear bilaterally to auscultation without wheezes, rales, or rhonchi. Breathing is unlabored. Heart: Regular rhythm. No murmurs, rubs, or gallops. Msk:  Strength and tone normal for age. Neuro: Alert and oriented X 3. Moves all extremities  spontaneously. Gait is normal. CNII-XII grossly in tact. Psych:  Responds to questions appropriately with a normal affect.     ASSESSMENT AND PLAN:  47 y.o. year old female with   1. Family history of cerebral aneurysm Will obtain imaging to R/O aneurysm.  2. Other complicated headache syndrome  3. Other headache syndrome - CT ANGIO HEAD W OR WO CONTRAST; Future   Signed, Shon Hale Dunn, [place],  [facility] 10/29/2017 2:23 PM

## 2017-11-04 ENCOUNTER — Other Ambulatory Visit: Payer: Self-pay | Admitting: Physician Assistant

## 2017-11-04 DIAGNOSIS — Z8249 Family history of ischemic heart disease and other diseases of the circulatory system: Secondary | ICD-10-CM

## 2017-11-04 DIAGNOSIS — G4489 Other headache syndrome: Secondary | ICD-10-CM

## 2017-11-04 DIAGNOSIS — G4459 Other complicated headache syndrome: Secondary | ICD-10-CM

## 2017-11-05 ENCOUNTER — Other Ambulatory Visit: Payer: Self-pay

## 2017-11-24 ENCOUNTER — Ambulatory Visit
Admission: RE | Admit: 2017-11-24 | Discharge: 2017-11-24 | Disposition: A | Discharge status: Home / Self Care | Payer: BLUE CROSS/BLUE SHIELD | Source: Ambulatory Visit | Attending: Physician Assistant | Admitting: Physician Assistant

## 2017-11-24 DIAGNOSIS — Z8249 Family history of ischemic heart disease and other diseases of the circulatory system: Secondary | ICD-10-CM

## 2017-11-24 DIAGNOSIS — G4489 Other headache syndrome: Secondary | ICD-10-CM

## 2017-11-24 DIAGNOSIS — G4459 Other complicated headache syndrome: Secondary | ICD-10-CM

## 2020-10-25 LAB — COLOGUARD: COLOGUARD: POSITIVE — AB

## 2022-04-01 ENCOUNTER — Emergency Department (HOSPITAL_BASED_OUTPATIENT_CLINIC_OR_DEPARTMENT_OTHER)
Admission: EM | Admit: 2022-04-01 | Discharge: 2022-04-01 | Disposition: A | Discharge status: Home / Self Care | Payer: 59 | Attending: Emergency Medicine | Admitting: Emergency Medicine

## 2022-04-01 ENCOUNTER — Emergency Department (HOSPITAL_BASED_OUTPATIENT_CLINIC_OR_DEPARTMENT_OTHER): Payer: 59 | Admitting: Radiology

## 2022-04-01 ENCOUNTER — Emergency Department (HOSPITAL_BASED_OUTPATIENT_CLINIC_OR_DEPARTMENT_OTHER): Payer: 59

## 2022-04-01 ENCOUNTER — Encounter (HOSPITAL_BASED_OUTPATIENT_CLINIC_OR_DEPARTMENT_OTHER): Payer: Self-pay

## 2022-04-01 ENCOUNTER — Other Ambulatory Visit: Payer: Self-pay

## 2022-04-01 DIAGNOSIS — S299XXA Unspecified injury of thorax, initial encounter: Secondary | ICD-10-CM | POA: Diagnosis present

## 2022-04-01 DIAGNOSIS — X58XXXA Exposure to other specified factors, initial encounter: Secondary | ICD-10-CM | POA: Diagnosis not present

## 2022-04-01 DIAGNOSIS — S20211A Contusion of right front wall of thorax, initial encounter: Secondary | ICD-10-CM | POA: Insufficient documentation

## 2022-04-01 DIAGNOSIS — M62838 Other muscle spasm: Secondary | ICD-10-CM | POA: Insufficient documentation

## 2022-04-01 DIAGNOSIS — R109 Unspecified abdominal pain: Secondary | ICD-10-CM

## 2022-04-01 DIAGNOSIS — R1011 Right upper quadrant pain: Secondary | ICD-10-CM | POA: Diagnosis not present

## 2022-04-01 LAB — CBC
HCT: 40.3 % (ref 36.0–46.0)
Hemoglobin: 14.2 g/dL (ref 12.0–15.0)
MCH: 33.7 pg (ref 26.0–34.0)
MCHC: 35.2 g/dL (ref 30.0–36.0)
MCV: 95.7 fL (ref 80.0–100.0)
Platelets: 258 10*3/uL (ref 150–400)
RBC: 4.21 MIL/uL (ref 3.87–5.11)
RDW: 12.3 % (ref 11.5–15.5)
WBC: 7.5 10*3/uL (ref 4.0–10.5)
nRBC: 0 % (ref 0.0–0.2)

## 2022-04-01 LAB — COMPREHENSIVE METABOLIC PANEL
ALT: 30 U/L (ref 0–44)
AST: 35 U/L (ref 15–41)
Albumin: 4.6 g/dL (ref 3.5–5.0)
Alkaline Phosphatase: 70 U/L (ref 38–126)
Anion gap: 9 (ref 5–15)
BUN: 17 mg/dL (ref 6–20)
CO2: 24 mmol/L (ref 22–32)
Calcium: 9.6 mg/dL (ref 8.9–10.3)
Chloride: 105 mmol/L (ref 98–111)
Creatinine, Ser: 0.68 mg/dL (ref 0.44–1.00)
GFR, Estimated: >60 mL/min (ref >60)
Glucose, Bld: 106 mg/dL — ABNORMAL HIGH (ref 70–99)
Potassium: 5.8 mmol/L — ABNORMAL HIGH (ref 3.5–5.1)
Sodium: 138 mmol/L (ref 135–145)
Total Bilirubin: 0.6 mg/dL (ref 0.3–1.2)
Total Protein: 7.6 g/dL (ref 6.5–8.1)

## 2022-04-01 LAB — URINALYSIS, ROUTINE W REFLEX MICROSCOPIC
Bilirubin Urine: NEGATIVE
Glucose, UA: NEGATIVE mg/dL
Hgb urine dipstick: NEGATIVE
Ketones, ur: NEGATIVE mg/dL
Leukocytes,Ua: NEGATIVE
Nitrite: NEGATIVE
Protein, ur: NEGATIVE mg/dL
Specific Gravity, Urine: 1.021 (ref 1.005–1.030)
pH: 7.5 (ref 5.0–8.0)

## 2022-04-01 LAB — POTASSIUM: Potassium: 4.2 mmol/L (ref 3.5–5.1)

## 2022-04-01 MED ORDER — IOHEXOL 300 MG/ML  SOLN
100.0000 mL | Freq: Once | INTRAMUSCULAR | Status: AC | PRN
  Administered 2022-04-01: 85 mL via INTRAVENOUS

## 2022-04-01 NOTE — Discharge Instructions (Signed)
Please use Tylenol or ibuprofen for pain.  You may use 600 mg ibuprofen every 6 hours or 1000 mg of Tylenol every 6 hours.  You may choose to alternate between the 2.  This would be most effective.  Not to exceed 4 g of Tylenol within 24 hours.  Not to exceed 3200 mg ibuprofen 24 hours.  You can use the incentive spirometer around once an hour for the next 1 to 2 weeks to encourage taking deep breaths as you feel any kind of bruising or costochondral separation on the right.

## 2022-04-01 NOTE — ED Provider Notes (Signed)
[facility] Provider Note   CSN: [id number] Arrival date & time: 04/01/22  1239     History  Chief Complaint  Patient presents with   Flank Pain    [name] is a 52 y.o. female with overall noncontributory past medical history presents with concern for severe right-sided flank pain that became sharp, worse with moving today, patient was choking on a belief on Saturday, her husband performed the Heimlich maneuver although at first was pressing more on her abdomen and ribs per patient, she reports that she has felt sore and bruised on the right side since then but when she shifted today she had a very severe pain, feeling like something is stabbing in her abdomen.  When relaxed she reports the pain is for the most part well-controlled, at possibly a 2/10, but severe pain when moving.   Flank Pain       Home Medications Prior to Admission medications   Medication Sig Start Date End Date Taking? Authorizing Provider  Calcium Carbonate-Vitamin D (CALCIUM 600+D) 600-400 MG-UNIT per tablet Take 1 tablet by mouth daily.    [provider]  Multiple Vitamin (MULTIVITAMIN WITH MINERALS) TABS Take 1 tablet by mouth daily.    [provider]      Allergies    Patient has no known allergies.    Review of Systems   Review of Systems  Genitourinary:  Positive for flank pain.  All other systems reviewed and are negative.   Physical Exam Updated Vital Signs BP (!) 154/94 (BP Location: Right Arm)   Pulse 69   Temp 97.9 F (36.6 C) (Oral)   Resp 20   Ht 5\' 8"  (1.727 m)   Wt 90.7 kg   SpO2 98%   BMI 30.41 kg/m  Physical Exam Vitals and nursing note reviewed.  Constitutional:      General: She is not in acute distress.    Appearance: Normal appearance.  HENT:     Head: Normocephalic and atraumatic.  Eyes:     General:        Right eye: No discharge.        Left eye: No discharge.  Cardiovascular:      Rate and Rhythm: Normal rate and regular rhythm.     Heart sounds: No murmur heard.    No friction rub. No gallop.  Pulmonary:     Effort: Pulmonary effort is normal.     Breath sounds: Normal breath sounds.     Comments: Normal breath sounds bilaterally, there is some bruising over the right lateral rib cage, no palpable step-off noted however Abdominal:     General: Bowel sounds are normal.     Palpations: Abdomen is soft.     Comments: significant tenderness to palpation of the right flank extending into the right upper quadrant, with no rebound, rigidity, some guarding  Skin:    General: Skin is warm and dry.     Capillary Refill: Capillary refill takes less than 2 seconds.  Neurological:     Mental Status: She is alert and oriented to person, place, and time.  Psychiatric:        Mood and Affect: Mood normal.        Behavior: Behavior normal.     ED Results / Procedures / Treatments   Labs (all labs ordered are listed, but only abnormal results are displayed) Labs Reviewed  COMPREHENSIVE METABOLIC PANEL - Abnormal; Notable for the following components:  Result Value   Potassium 5.8 (*)    Glucose, Bld 106 (*)    All other components within normal limits  CBC  URINALYSIS, ROUTINE W REFLEX MICROSCOPIC  POTASSIUM    EKG None  Radiology CT ABDOMEN PELVIS W CONTRAST  Result Date: 04/01/2022 CLINICAL DATA:  Acute right-sided abdominal and flank pain. Recent choking and Heimlich maneuver. EXAM: CT ABDOMEN AND PELVIS WITH CONTRAST TECHNIQUE: Multidetector CT imaging of the abdomen and pelvis was performed using the standard protocol following bolus administration of intravenous contrast. RADIATION DOSE REDUCTION: This exam was performed according to the departmental dose-optimization program which includes automated exposure control, adjustment of the mA and/or kV according to patient size and/or use of iterative reconstruction technique. CONTRAST:  98mL OMNIPAQUE IOHEXOL  300 MG/ML  SOLN COMPARISON:  None Available. FINDINGS: Lower Chest: No acute findings. Hepatobiliary: No hepatic masses identified. No evidence of hepatic parenchymal injury or perihepatic hematoma. Gallbladder is unremarkable. No evidence of biliary ductal dilatation. Pancreas: No mass or inflammatory changes. No evidence of pancreatic parenchymal injury. Spleen: Within normal limits in size and appearance. No evidence of splenic injury or perisplenic hematoma. Adrenals/Urinary Tract: No suspicious masses identified. No evidence of renal parenchymal injury. No evidence of ureteral calculi or hydronephrosis. Stomach/Bowel: No evidence of obstruction, inflammatory process or abnormal fluid collections. Normal appendix visualized. Sigmoid diverticulosis noted, without evidence of diverticulitis. No evidence of hemoperitoneum. Vascular/Lymphatic: No pathologically enlarged lymph nodes. No acute vascular findings. Reproductive: Uterus is unremarkable. Benign-appearing left ovarian cyst is seen measuring 3.1 cm. No evidence of inflammatory changes or free fluid. Other:  None. Musculoskeletal:  No suspicious bone lesions identified. IMPRESSION: No acute findings. Benign-appearing left ovarian cyst measuring 3.1 cm. Recommend follow-up US in 6-12 months. Note: This recommendation does not apply to premenarchal patients and to those with increased risk (genetic, family history, elevated tumor markers or other high-risk factors) of ovarian cancer. Reference: JACR 2020 Feb; 17(2):248-254 Colonic diverticulosis, without radiographic evidence of diverticulitis. Electronically Signed   By: Marlaine Hind M.D.   On: 04/01/2022 14:04   DG Ribs Unilateral W/Chest Right  Result Date: 04/01/2022 CLINICAL DATA:  Chest pain.  Given Heimlich maneuver on Saturday. EXAM: RIGHT RIBS AND CHEST - 3+ VIEW COMPARISON:  None Available. FINDINGS: Frontal view of the chest and four views of right ribs. Frontal view of the chest demonstrates  midline trachea. Normal heart size and mediastinal contours. No pleural effusion or pneumothorax. Clear lungs. Right rib radiographs demonstrate no displaced fracture. IMPRESSION: No acute findings. Electronically Signed   By: Abigail Miyamoto M.D.   On: 04/01/2022 13:33    Procedures Procedures    Medications Ordered in ED Medications  iohexol (OMNIPAQUE) 300 MG/ML solution 100 mL (85 mLs Intravenous Contrast Given 04/01/22 1346)    ED Course/ Medical Decision Making/ A&P                             Medical Decision Making Amount and/or Complexity of Data Reviewed Labs: ordered. Radiology: ordered.  Risk Prescription drug management.   This patient is a 52 y.o. female  who presents to the ED for concern of right-sided flank pain, and abdominal pain with concern for having recently received the Heimlich maneuver this weekend.   Differential diagnoses prior to evaluation: The emergent differential diagnosis includes, but is not limited to, concern for rib fracture versus bruised rib, versus costochondral separation, with also possible concern for interval laceration of liver secondary  to possible rib fracture, versus kidney stone, other muscle spasm. This is not an exhaustive differential.   Past Medical History / Co-morbidities: Overall noncontributory past medical history, no evidence of kidney dysfunction, previous hyperkalemia,  Physical Exam: Physical exam performed. The pertinent findings include: Patient with some focal tenderness in the right ribs, with no step-off, deformity, normal breath sounds bilaterally, she has some tenderness extending into the anterior abdomen with mild bruising, no distention.  Lab Tests/Imaging studies: I personally interpreted labs/imaging and the pertinent results include: CBC unremarkable, UA unremarkable, CMP notable for significant hyperkalemia, potassium 5.8, overall suspicious of hemolysis, and on recheck potassium 4.2.  I dependently  interpreted CT abdomen pelvis, and DG ribs with chest x-ray, shows no evidence of acute rib fracture, no evidence of any intrathoracic, intraabdominal trauma, laceration of liver, she has some diverticulosis without diverticulitis as well as a benign-appearing 3.1 mm left ovarian cyst.  I agree with the radiologist interpretation.   Disposition: After consideration of the diagnostic results and the patients response to treatment, I feel that patient's symptoms are overall consistent with bruised rib versus mild costochondral separation, with some muscle spasm as the presumed cause for her severe pain on arrival, her pain is improved without any intervention at time of discharge, encouraged ibuprofen, Tylenol, heat, ice, rest, and will provide incentive spirometry to ensure she continues to take good deep breaths.Marland Kitchen   emergency department workup does not suggest an emergent condition requiring admission or immediate intervention beyond what has been performed at this time. The plan is: as above. The patient is safe for discharge and has been instructed to return immediately for worsening symptoms, change in symptoms or any other concerns.  Final Clinical Impression(s) / ED Diagnoses Final diagnoses:  Muscle spasm  Flank pain    Rx / DC Orders ED Discharge Orders     None         Anselmo Pickler, PA-C 04/01/22 1655    Wyvonnia Dusky, MD 04/02/22 [phone]

## 2022-04-01 NOTE — ED Notes (Signed)
ED PA at [facility] 

## 2022-04-01 NOTE — ED Notes (Signed)
Prefers to stand. Pain worse with reclining and sitting. To CT. Steady gait.

## 2022-04-01 NOTE — ED Notes (Signed)
Steady gait to b/r at d/c. Pt in b/r.

## 2022-04-01 NOTE — ED Notes (Signed)
RT at [facility] with IS

## 2022-04-01 NOTE — ED Triage Notes (Signed)
Patient here POV from Home.  Endorses choking on Saturday on a bay leaf and having the Heimlich performed. Sore since but pain became severe and sharp today. Located to Right Flank.   NAD Noted during Triage. A&Ox4. GCS 15. Ambulatory.

## 2023-04-14 NOTE — Progress Notes (Unsigned)
[facility] [address] [place] [address] Phone: [phone] Subjective:   [name], am serving as a scribe for Dr. Antoine Primas.  I'm seeing this patient by the request  of:  Cordial, Harlan Stains, MD  CC: Left foot pain  [id number]  [name] is a 53 y.o. female coming in with complaint of L foot pain over medial aspect of heel. Patient states that pain started at the end of December 2024 after doing a short jog from her truck to [employer]. Felt a sharp pain like an ice pick went in her foot. Placed herself on crutches. Went to PT. Has been doing exercises with PT. Was referred by PT.        Past Medical History:  Diagnosis Date   AMA (advanced maternal age) multigravida 35+    Anemia    hx in patient's 20's   Anxiety    hx ppd   Arthritis    left thumb, left hip   Arthritis    cortisone injection left thumb 3 wks ago   Depression    hx ppd   H/O rubella    H/O varicella    Headache(784.0)    otc med prn   Hip joint pain, right 06/23/07   Hyperlipidemia    diet controlled   Leukorrhea 2010   Physiologic   Pelvic pain in female 10/23/05   postpartum   Seasonal allergies    SVD (spontaneous vaginal delivery)    x 1   Vaginal irritation 09/26/05   Past Surgical History:  Procedure Laterality Date   CESAREAN SECTION     x 1   FETAL SURGERY SELECTIVE FETOSCOPIC LASER PHOTOCOAGULATION (SFLP) W/ LAPAROTOMY     LAPAROSCOPIC TUBAL LIGATION  11/29/2011   Procedure: LAPAROSCOPIC TUBAL LIGATION;  Surgeon: Kirkland Hun, MD;  Location: [facility];  Service: Gynecology;  Laterality: Bilateral;  Laparoscopic tubal ligation with cautery.   TUBAL LIGATION     WISDOM TOOTH EXTRACTION  1988   WRIST SURGERY  2006   right   Social History   Socioeconomic History   Marital status: Married    Spouse name: Not on file   Number of children: Not on file   Years of education: Not on file   Highest education level: Not on file   Occupational History   Not on file  Tobacco Use   Smoking status: Former    Current packs/day: 0.00    Average packs/day: 1 pack/day for 5.0 years (5.0 ttl pk-yrs)    Types: Cigarettes    Start date: 02/25/1991    Quit date: 02/25/1996    Years since quitting: 27.1   Smokeless tobacco: Never  Substance and Sexual Activity   Alcohol use: Yes    Comment: socially   Drug use: No   Sexual activity: Yes    Birth control/protection: I.U.D.    Comment: IUD was removed 09/2011  Other Topics Concern   Not on file  Social History Narrative   Not on file   Social Drivers of Health   Financial Resource Strain: Not on file  Food Insecurity: Not on file  Transportation Needs: Not on file  Physical Activity: Not on file  Stress: Not on file  Social Connections: Not on file   No Known Allergies Family History  Problem Relation Age of Onset   Hypertension Mother    Hypertension Sister    Cancer Paternal Celine Ahr  lung   Heart disease Paternal Uncle    Cancer Paternal Uncle    Stroke Paternal Grandmother     Current Outpatient Medications (Endocrine & Metabolic):    predniSONE (DELTASONE) 20 MG tablet, Take 2 tablets (40 mg total) by mouth daily with breakfast.      Current Outpatient Medications (Other):    Calcium Carbonate-Vitamin D (CALCIUM 600+D) 600-400 MG-UNIT per tablet, Take 1 tablet by mouth daily.   Multiple Vitamin (MULTIVITAMIN WITH MINERALS) TABS, Take 1 tablet by mouth daily.   Reviewed prior external information including notes and imaging from  primary care provider As well as notes that were available from care everywhere and other healthcare systems.  Past medical history, social, surgical and family history all reviewed in electronic medical record.  No pertanent information unless stated regarding to the chief complaint.   Review of Systems:  No headache, visual changes, nausea, vomiting, diarrhea, constipation, dizziness, abdominal pain, skin rash,  fevers, chills, night sweats, weight loss, swollen lymph nodes, body aches, joint swelling, chest pain, shortness of breath, mood changes. POSITIVE muscle aches  Objective  Blood pressure (!) 130/92, pulse 76, height 5\' 8"  (1.727 m), weight 207 lb (93.9 kg), last menstrual period 06/18/2017, SpO2 97%.   General: No apparent distress alert and oriented x3 mood and affect normal, dressed appropriately.  HEENT: Pupils equal, extraocular movements intact  Respiratory: Patient's speak in full sentences and does not appear short of breath  Cardiovascular: No lower extremity edema, non tender, no erythema  Left foot exam shows patient does have some breakdown of the longitudinal arch.  Does have swelling of the fat pad noted.  Tender to palpation over the plantar aspect of the calcaneal area.  Limited muscular skeletal ultrasound was performed and interpreted by Antoine Primas, M   Limited ultrasound shows hypoechoic changes in the plantar fascia that is consistent with severe plantar fasciitis.  No cortical irregularity noted of the imaging.  Achilles tendon appears to be intact.  Impression: Severe plantar fasciitis     Impression and Recommendations:    The above documentation has been reviewed and is accurate and complete Judi Saa, DO

## 2023-04-17 ENCOUNTER — Ambulatory Visit (INDEPENDENT_AMBULATORY_CARE_PROVIDER_SITE_OTHER): Payer: 59

## 2023-04-17 ENCOUNTER — Encounter: Payer: Self-pay | Admitting: Family Medicine

## 2023-04-17 ENCOUNTER — Other Ambulatory Visit: Payer: Self-pay

## 2023-04-17 ENCOUNTER — Ambulatory Visit: Payer: 59 | Admitting: Family Medicine

## 2023-04-17 VITALS — BP 130/92 | HR 76 | Ht 68.0 in | Wt 207.0 lb

## 2023-04-17 DIAGNOSIS — M722 Plantar fascial fibromatosis: Secondary | ICD-10-CM

## 2023-04-17 DIAGNOSIS — M79672 Pain in left foot: Secondary | ICD-10-CM

## 2023-04-17 MED ORDER — PREDNISONE 20 MG PO TABS: 40.0000 mg | ORAL_TABLET | Freq: Every day | ORAL | 0 refills | Status: DC

## 2023-04-17 NOTE — Assessment & Plan Note (Addendum)
Plantar fasciitis noted but also has fat pad irritation.  Increase activity slowly.  Discussed icing regimen at home exercises, discussed which activities to do and which ones to avoid.  Increase activity slowly.  Follow-up again in 6 to 8 weeks.  Worsening pain consider injections.  Did discuss over-the-counter orthotics that can be beneficial.  Prednisone given for 5 days.

## 2023-04-17 NOTE — Patient Instructions (Signed)
Exercises Pred 40 for 5 days HOKA recovery sandals Spenco Total Support Orthotics Xray today See me in 6-8 weeks

## 2023-04-18 ENCOUNTER — Encounter: Payer: Self-pay | Admitting: Family Medicine

## 2023-04-22 ENCOUNTER — Encounter: Payer: Self-pay | Admitting: Family Medicine

## 2023-05-04 ENCOUNTER — Encounter: Payer: Self-pay | Admitting: Family Medicine

## 2023-05-05 ENCOUNTER — Encounter: Payer: Self-pay | Admitting: Family Medicine

## 2023-05-28 NOTE — Progress Notes (Unsigned)
 [facility] [address] [place] [address] Phone: [phone] Subjective:   Bruce Donath, am serving as a scribe for Dr. Antoine Primas.  I'm seeing this patient by the request  of:  Cordial, Harlan Stains, MD  CC: Left foot pain  [id number]  04/17/2023 Plantar fasciitis noted but also has fat pad irritation.  Increase activity slowly.  Discussed icing regimen at home exercises, discussed which activities to do and which ones to avoid.  Increase activity slowly.  Follow-up again in 6 to 8 weeks.  Worsening pain consider injections.  Did discuss over-the-counter orthotics that can be beneficial.  Prednisone given for 5 days.      Update 05/29/2023 Carnesha Maravilla is a 53 y.o. female coming in with complaint of L foot pain. Patient states that she is not improving. Has been diligent about icing and stretching. Standing in one place increases her pain. Using [employer] covering sandals and [employer]. Tennis shoes also increase her pain. Going on a month vacation in May and wants to feel better.       Past Medical History:  Diagnosis Date   AMA (advanced maternal age) multigravida 35+    Anemia    hx in patient's 20's   Anxiety    hx ppd   Arthritis    left thumb, left hip   Arthritis    cortisone injection left thumb 3 wks ago   Depression    hx ppd   H/O rubella    H/O varicella    Headache(784.0)    otc med prn   Hip joint pain, right 06/23/07   Hyperlipidemia    diet controlled   Leukorrhea 2010   Physiologic   Pelvic pain in female 10/23/05   postpartum   Seasonal allergies    SVD (spontaneous vaginal delivery)    x 1   Vaginal irritation 09/26/05   Past Surgical History:  Procedure Laterality Date   CESAREAN SECTION     x 1   FETAL SURGERY SELECTIVE FETOSCOPIC LASER PHOTOCOAGULATION (SFLP) W/ LAPAROTOMY     LAPAROSCOPIC TUBAL LIGATION  11/29/2011   Procedure: LAPAROSCOPIC TUBAL LIGATION;  Surgeon: Kirkland Hun, MD;   Location: [facility];  Service: Gynecology;  Laterality: Bilateral;  Laparoscopic tubal ligation with cautery.   TUBAL LIGATION     WISDOM TOOTH EXTRACTION  1988   WRIST SURGERY  2006   right   Social History   Socioeconomic History   Marital status: Married    Spouse name: Not on file   Number of children: Not on file   Years of education: Not on file   Highest education level: Not on file  Occupational History   Not on file  Tobacco Use   Smoking status: Former    Current packs/day: 0.00    Average packs/day: 1 pack/day for 5.0 years (5.0 ttl pk-yrs)    Types: Cigarettes    Start date: 02/25/1991    Quit date: 02/25/1996    Years since quitting: 27.2   Smokeless tobacco: Never  Substance and Sexual Activity   Alcohol use: Yes    Comment: socially   Drug use: No   Sexual activity: Yes    Birth control/protection: I.U.D.    Comment: IUD was removed 09/2011  Other Topics Concern   Not on file  Social History Narrative   Not on file   Social Drivers of Health   Financial Resource Strain: Not on file  Food Insecurity: Not on  file  Transportation Needs: Not on file  Physical Activity: Not on file  Stress: Not on file  Social Connections: Not on file   No Known Allergies Family History  Problem Relation Age of Onset   Hypertension Mother    Hypertension Sister    Cancer Paternal Aunt        lung   Heart disease Paternal Uncle    Cancer Paternal Uncle    Stroke Paternal Grandmother     Current Outpatient Medications (Endocrine & Metabolic):    predniSONE (DELTASONE) 20 MG tablet, Take 2 tablets (40 mg total) by mouth daily with breakfast.      Current Outpatient Medications (Other):    Calcium Carbonate-Vitamin D (CALCIUM 600+D) 600-400 MG-UNIT per tablet, Take 1 tablet by mouth daily.   Multiple Vitamin (MULTIVITAMIN WITH MINERALS) TABS, Take 1 tablet by mouth daily.   Reviewed prior external information including notes and imaging from  primary care  provider As well as notes that were available from care everywhere and other healthcare systems.  Past medical history, social, surgical and family history all reviewed in electronic medical record.  No pertanent information unless stated regarding to the chief complaint.   Review of Systems:  No headache, visual changes, nausea, vomiting, diarrhea, constipation, dizziness, abdominal pain, skin rash, fevers, chills, night sweats, weight loss, swollen lymph nodes, body aches, joint swelling, chest pain, shortness of breath, mood changes. POSITIVE muscle aches  Objective  Blood pressure 106/68, pulse 67, height 5\' 8"  (1.727 m), weight 226 lb (102.5 kg), last menstrual period 06/18/2017, SpO2 97%.   General: No apparent distress alert and oriented x3 mood and affect normal, dressed appropriately.  HEENT: Pupils equal, extraocular movements intact  Respiratory: Patient's speak in full sentences and does not appear short of breath  Cardiovascular: No lower extremity edema, non tender, no erythema  Severely tender to palpation on the plantar aspect of the calcaneal bone mostly midline and more medial.  Procedure: Real-time Ultrasound Guided Injection of left plantar fascia Device: GE Logiq Q7 Ultrasound guided injection is preferred based studies that show increased duration, increased effect, greater accuracy, decreased procedural pain, increased response rate, and decreased cost with ultrasound guided versus blind injection.  Verbal informed consent obtained.  Time-out conducted.  Noted no overlying erythema, induration, or other signs of local infection.  Skin prepped in a sterile fashion.  Local anesthesia: Topical Ethyl chloride.  With sterile technique and under real time ultrasound guidance: With a 21-gauge 2 inch needle injected with 0.5 cc of 0.5% Marcaine and 1 cc of Kenalog 40 mg/mL into the origin of the plantar fascia at the calcaneal area. Completed without difficulty  Pain  immediately resolved suggesting accurate placement of the medication.  Advised to call if fevers/chills, erythema, induration, drainage, or persistent bleeding.  Images saved Impression: Technically successful ultrasound guided injection.    Impression and Recommendations:     The above documentation has been reviewed and is accurate and complete Judi Saa, DO

## 2023-05-29 ENCOUNTER — Other Ambulatory Visit: Payer: Self-pay

## 2023-05-29 ENCOUNTER — Ambulatory Visit: Payer: 59 | Admitting: Family Medicine

## 2023-05-29 ENCOUNTER — Encounter: Payer: Self-pay | Admitting: Family Medicine

## 2023-05-29 VITALS — BP 106/68 | HR 67 | Ht 68.0 in | Wt 226.0 lb

## 2023-05-29 DIAGNOSIS — M79672 Pain in left foot: Secondary | ICD-10-CM

## 2023-05-29 DIAGNOSIS — M722 Plantar fascial fibromatosis: Secondary | ICD-10-CM

## 2023-05-29 NOTE — Assessment & Plan Note (Signed)
 Given injection today and tolerated the procedure well, discussed icing regimen and home exercises, discussed which activities to do and which ones to avoid.  Hopeful that this will make improvement.  Does have a heel spur and could be a candidate for potentially shockwave therapy as well.  Discussed red light therapy.  Discussed proper shoes.  Follow-up with me again in 2 to 3 months

## 2023-05-29 NOTE — Patient Instructions (Addendum)
 Good to see you Injected plantar fascia, Restart exercises in 72 hours Continue the good shoes, Have a great trip. If red light therapy look at 680 nm. Make an appointment in 10 to 12 weeks

## 2023-06-08 NOTE — Telephone Encounter (Signed)
 Scheduled

## 2023-06-16 ENCOUNTER — Ambulatory Visit: Payer: Self-pay | Admitting: Family Medicine

## 2023-06-16 VITALS — BP 152/96 | HR 79 | Ht 68.0 in | Wt 207.0 lb

## 2023-06-16 DIAGNOSIS — M722 Plantar fascial fibromatosis: Secondary | ICD-10-CM

## 2023-06-16 NOTE — Patient Instructions (Addendum)
 Thank you for coming in today.   Silicone Heel Protector, Gel Heel Cups (4 Pairs), Plantar Fasciitis, Blister & Cracked Foot Relief, Heel & Achilles Pain Aid for Men & Women  [email or web address]  Schedule weekly for the next two weeks until your trip.   Could do twice weekly.

## 2023-06-16 NOTE — Progress Notes (Signed)
    [name] is a 53 y.o. female who presents to [facility] today for ECSWT consult/treatment for her L foot pain. Pain started on Dec 23rd. Pt was last seen by Dr. Felipe Horton on 05/29/23. Pt locates pain to the plantar aspect of her L calcaneous.  She notes she is leaving soon for a month long vacation to [place] and [place].  Treatments tried: icing, stretching, good footwear, red light therapy               [facility] [address] [place] [address] Phone: [phone]   Extracorporeal Shockwave Therapy Note    Patient is being treated today with ECSWT. Informed consent was obtained and patient tolerated procedure well.   Therapy performed by Garlan Juniper  Condition treated: Left plantar fasciitis Treatment preset used: Plantar fasciitis Energy used: 120 mJ Frequency used: 15 Hz Number of pulses: 2000 Head Size: Medium Treatment #1 of #3-4  Electronically signed by:  [facility] 3:41 PM 06/16/23

## 2023-06-22 ENCOUNTER — Ambulatory Visit: Payer: Self-pay | Admitting: Family Medicine

## 2023-06-22 DIAGNOSIS — M722 Plantar fascial fibromatosis: Secondary | ICD-10-CM

## 2023-06-22 NOTE — Progress Notes (Signed)
   [name] [address] [place] [address] Phone: [phone]   Extracorporeal Shockwave Therapy Note    Patient is being treated today with ECSWT. Informed consent was obtained and patient tolerated procedure well.   Therapy performed by Garlan Juniper  Condition treated: Plantar fasciitis Treatment preset used: Plantar fasciitis Energy used: 120 mJ Frequency used: 15 Hz Number of pulses: 2000 Head Size: Medium Treatment #2 of #4?  Electronically signed by:  [name] 1:59 PM 06/22/23  Patient has an important trip that she is leaving for on May 14.  She will reschedule her shockwave appointment that she has scheduled for May 5 for May 7 and we will proceed with an injection likely at that time.

## 2023-06-29 ENCOUNTER — Ambulatory Visit: Payer: Self-pay | Admitting: Family Medicine

## 2023-06-29 DIAGNOSIS — M722 Plantar fascial fibromatosis: Secondary | ICD-10-CM

## 2023-06-29 NOTE — Progress Notes (Signed)
   [name] [address] [place] [address] Phone: [phone]   Extracorporeal Shockwave Therapy Note    Patient is being treated today with ECSWT. Informed consent was obtained and patient tolerated procedure well.   Therapy performed by Garlan Juniper  Condition treated: Left plantar fasciitis Treatment preset used: Plantar fasciitis Energy used: 120 mJ Frequency used: 15 Hz Number of pulses: 2000 Head Size: Medium Treatment #3 of #4  Electronically signed by:  [name] 12:54 PM 06/29/23 She was leaving for a [place] trip just after her next visit.  Consider prescribing prednisone  and hydrocodone .

## 2023-07-01 ENCOUNTER — Ambulatory Visit: Payer: Self-pay | Admitting: Family Medicine

## 2023-07-06 ENCOUNTER — Ambulatory Visit (INDEPENDENT_AMBULATORY_CARE_PROVIDER_SITE_OTHER): Payer: Self-pay | Admitting: Family Medicine

## 2023-07-06 DIAGNOSIS — M722 Plantar fascial fibromatosis: Secondary | ICD-10-CM

## 2023-07-06 MED ORDER — PREDNISONE 50 MG PO TABS: 50.0000 mg | ORAL_TABLET | Freq: Every day | ORAL | 0 refills | Status: AC

## 2023-07-06 NOTE — Patient Instructions (Signed)
 Thank you for coming in today.   Use the prednisone  if needed.   Enjoy your trip.

## 2023-07-06 NOTE — Progress Notes (Signed)
   [name] [address] [place] [address] Phone: [phone]   Extracorporeal Shockwave Therapy Note    Patient is being treated today with ECSWT. Informed consent was obtained and patient tolerated procedure well.   Therapy performed by Garlan Juniper  Condition treated: Left plantar fasciitis Treatment preset used: Plantar fasciitis Energy used: 120 mJ Frequency used: 15 Hz Number of pulses: 2000 Head Size: Medium Treatment #4 of #4  Electronically signed by:  [name] 1:05 PM 07/06/23 Prednisone  prescribed prior to her trip.  She leaves Wednesday for [place] and [place]

## 2023-08-06 NOTE — Progress Notes (Deleted)
 [facility] [address] [place] [address] Phone: [phone] Subjective:    I'm seeing this patient by the request  of:  Cordial, Collin Deal, MD  CC:   [id number]  [name] is a 53 y.o. female coming in with complaint of L plantar fascia pain. Has been doing shockwave therapy. Patient states       Past Medical History:  Diagnosis Date   AMA (advanced maternal age) multigravida 35+    Anemia    hx in patient's 20's   Anxiety    hx ppd   Arthritis    left thumb, left hip   Arthritis    cortisone injection left thumb 3 wks ago   Depression    hx ppd   H/O rubella    H/O varicella    Headache(784.0)    otc med prn   Hip joint pain, right 06/23/07   Hyperlipidemia    diet controlled   Leukorrhea 2010   Physiologic   Pelvic pain in female 10/23/05   postpartum   Seasonal allergies    SVD (spontaneous vaginal delivery)    x 1   Vaginal irritation 09/26/05   Past Surgical History:  Procedure Laterality Date   CESAREAN SECTION     x 1   FETAL SURGERY SELECTIVE FETOSCOPIC LASER PHOTOCOAGULATION (SFLP) W/ LAPAROTOMY     LAPAROSCOPIC TUBAL LIGATION  11/29/2011   Procedure: LAPAROSCOPIC TUBAL LIGATION;  Surgeon: Lula Sale, MD;  Location: [facility];  Service: Gynecology;  Laterality: Bilateral;  Laparoscopic tubal ligation with cautery.   TUBAL LIGATION     WISDOM TOOTH EXTRACTION  1988   WRIST SURGERY  2006   right   Social History   Socioeconomic History   Marital status: Married    Spouse name: Not on file   Number of children: Not on file   Years of education: Not on file   Highest education level: Not on file  Occupational History   Not on file  Tobacco Use   Smoking status: Former    Current packs/day: 0.00    Average packs/day: 1 pack/day for 5.0 years (5.0 ttl pk-yrs)    Types: Cigarettes    Start date: 02/25/1991    Quit date: 02/25/1996    Years since quitting: 27.4   Smokeless tobacco: Never   Substance and Sexual Activity   Alcohol use: Yes    Comment: socially   Drug use: No   Sexual activity: Yes    Birth control/protection: I.U.D.    Comment: IUD was removed 09/2011  Other Topics Concern   Not on file  Social History Narrative   Not on file   Social Drivers of Health   Financial Resource Strain: Not on file  Food Insecurity: Not on file  Transportation Needs: Not on file  Physical Activity: Not on file  Stress: Not on file  Social Connections: Not on file   No Known Allergies Family History  Problem Relation Age of Onset   Hypertension Mother    Hypertension Sister    Cancer Paternal Aunt        lung   Heart disease Paternal Uncle    Cancer Paternal Uncle    Stroke Paternal Grandmother     Current Outpatient Medications (Endocrine & Metabolic):    predniSONE  (DELTASONE ) 50 MG tablet, Take 1 tablet (50 mg total) by mouth daily.      Current Outpatient Medications (Other):    Calcium Carbonate-Vitamin D (CALCIUM 600+D)  600-400 MG-UNIT per tablet, Take 1 tablet by mouth daily.   Multiple Vitamin (MULTIVITAMIN WITH MINERALS) TABS, Take 1 tablet by mouth daily.   Reviewed prior external information including notes and imaging from  primary care provider As well as notes that were available from care everywhere and other healthcare systems.  Past medical history, social, surgical and family history all reviewed in electronic medical record.  No pertanent information unless stated regarding to the chief complaint.   Review of Systems:  No headache, visual changes, nausea, vomiting, diarrhea, constipation, dizziness, abdominal pain, skin rash, fevers, chills, night sweats, weight loss, swollen lymph nodes, body aches, joint swelling, chest pain, shortness of breath, mood changes. POSITIVE muscle aches  Objective  Last menstrual period 06/18/2017.   General: No apparent distress alert and oriented x3 mood and affect normal, dressed appropriately.   HEENT: Pupils equal, extraocular movements intact  Respiratory: Patient's speak in full sentences and does not appear short of breath  Cardiovascular: No lower extremity edema, non tender, no erythema      Impression and Recommendations:

## 2023-08-07 ENCOUNTER — Ambulatory Visit: Admitting: Family Medicine

## 2024-03-30 ENCOUNTER — Encounter (HOSPITAL_BASED_OUTPATIENT_CLINIC_OR_DEPARTMENT_OTHER): Payer: Self-pay | Admitting: Pulmonary Disease

## 2024-03-30 DIAGNOSIS — G4710 Hypersomnia, unspecified: Secondary | ICD-10-CM

## 2024-03-30 DIAGNOSIS — R5383 Other fatigue: Secondary | ICD-10-CM

## 2024-03-30 DIAGNOSIS — R0683 Snoring: Secondary | ICD-10-CM
